# Patient Record
Sex: Female | Born: 1980 | Race: White | Hispanic: No | Marital: Single | Smoking: Current every day smoker
Health system: Southern US, Community
[De-identification: ages and names within clinical notes are randomized; demographics above are authoritative.]

## PROBLEM LIST (undated history)

## (undated) DIAGNOSIS — R87612 Low grade squamous intraepithelial lesion on cytologic smear of cervix (LGSIL): Secondary | ICD-10-CM

## (undated) DIAGNOSIS — N289 Disorder of kidney and ureter, unspecified: Secondary | ICD-10-CM

## (undated) DIAGNOSIS — D649 Anemia, unspecified: Secondary | ICD-10-CM

## (undated) DIAGNOSIS — N390 Urinary tract infection, site not specified: Secondary | ICD-10-CM

## (undated) HISTORY — PX: INCISIONAL HERNIA REPAIR: SHX193

## (undated) HISTORY — DX: Low grade squamous intraepithelial lesion on cytologic smear of cervix (LGSIL): R87.612

## (undated) HISTORY — PX: BLADDER REPAIR: SHX76

---
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
## 2002-01-06 ENCOUNTER — Inpatient Hospital Stay (HOSPITAL_COMMUNITY): Admission: AD | Admit: 2002-01-06 | Discharge: 2002-01-06 | Payer: Self-pay | Admitting: *Deleted

## 2002-01-13 ENCOUNTER — Inpatient Hospital Stay (HOSPITAL_COMMUNITY): Admission: AD | Admit: 2002-01-13 | Discharge: 2002-01-13 | Payer: Self-pay | Admitting: Obstetrics and Gynecology

## 2002-06-04 ENCOUNTER — Inpatient Hospital Stay (HOSPITAL_COMMUNITY): Admission: AD | Admit: 2002-06-04 | Discharge: 2002-06-04 | Payer: Self-pay | Admitting: Obstetrics & Gynecology

## 2002-06-06 ENCOUNTER — Inpatient Hospital Stay (HOSPITAL_COMMUNITY): Admission: AD | Admit: 2002-06-06 | Discharge: 2002-06-09 | Payer: Self-pay | Admitting: Obstetrics

## 2002-12-18 ENCOUNTER — Emergency Department (HOSPITAL_COMMUNITY): Admission: EM | Admit: 2002-12-18 | Discharge: 2002-12-18 | Payer: Self-pay | Admitting: Emergency Medicine

## 2003-03-25 ENCOUNTER — Inpatient Hospital Stay (HOSPITAL_COMMUNITY): Admission: AD | Admit: 2003-03-25 | Discharge: 2003-03-25 | Payer: Self-pay | Admitting: Family Medicine

## 2003-03-25 ENCOUNTER — Encounter: Payer: Self-pay | Admitting: Family Medicine

## 2003-06-06 ENCOUNTER — Ambulatory Visit (HOSPITAL_COMMUNITY): Admission: RE | Admit: 2003-06-06 | Discharge: 2003-06-06 | Payer: Self-pay | Admitting: *Deleted

## 2003-08-01 ENCOUNTER — Emergency Department (HOSPITAL_COMMUNITY): Admission: EM | Admit: 2003-08-01 | Discharge: 2003-08-01 | Payer: Self-pay | Admitting: Emergency Medicine

## 2003-09-17 ENCOUNTER — Ambulatory Visit (HOSPITAL_COMMUNITY): Admission: RE | Admit: 2003-09-17 | Discharge: 2003-09-17 | Payer: Self-pay | Admitting: *Deleted

## 2003-10-10 ENCOUNTER — Observation Stay (HOSPITAL_COMMUNITY): Admission: AD | Admit: 2003-10-10 | Discharge: 2003-10-11 | Payer: Self-pay | Admitting: Obstetrics and Gynecology

## 2003-10-29 ENCOUNTER — Inpatient Hospital Stay (HOSPITAL_COMMUNITY): Admission: RE | Admit: 2003-10-29 | Discharge: 2003-11-01 | Payer: Self-pay | Admitting: Family Medicine

## 2003-10-29 ENCOUNTER — Encounter (INDEPENDENT_AMBULATORY_CARE_PROVIDER_SITE_OTHER): Payer: Self-pay | Admitting: Specialist

## 2003-11-06 ENCOUNTER — Inpatient Hospital Stay (HOSPITAL_COMMUNITY): Admission: AD | Admit: 2003-11-06 | Discharge: 2003-11-06 | Payer: Self-pay | Admitting: Obstetrics

## 2003-12-14 ENCOUNTER — Inpatient Hospital Stay (HOSPITAL_COMMUNITY): Admission: AD | Admit: 2003-12-14 | Discharge: 2003-12-14 | Payer: Self-pay | Admitting: Family Medicine

## 2004-02-09 ENCOUNTER — Inpatient Hospital Stay (HOSPITAL_COMMUNITY): Admission: AD | Admit: 2004-02-09 | Discharge: 2004-02-09 | Payer: Self-pay | Admitting: Obstetrics and Gynecology

## 2004-04-03 ENCOUNTER — Emergency Department (HOSPITAL_COMMUNITY): Admission: EM | Admit: 2004-04-03 | Discharge: 2004-04-03 | Payer: Self-pay | Admitting: Emergency Medicine

## 2004-07-08 ENCOUNTER — Encounter: Admission: RE | Admit: 2004-07-08 | Discharge: 2004-07-08 | Payer: Self-pay | Admitting: Family Medicine

## 2004-12-07 ENCOUNTER — Emergency Department (HOSPITAL_COMMUNITY): Admission: EM | Admit: 2004-12-07 | Discharge: 2004-12-07 | Payer: Self-pay | Admitting: Emergency Medicine

## 2004-12-13 ENCOUNTER — Emergency Department (HOSPITAL_COMMUNITY): Admission: EM | Admit: 2004-12-13 | Discharge: 2004-12-13 | Payer: Self-pay | Admitting: *Deleted

## 2005-04-06 ENCOUNTER — Emergency Department (HOSPITAL_COMMUNITY): Admission: EM | Admit: 2005-04-06 | Discharge: 2005-04-06 | Payer: Self-pay | Admitting: Emergency Medicine

## 2005-06-19 ENCOUNTER — Emergency Department (HOSPITAL_COMMUNITY): Admission: EM | Admit: 2005-06-19 | Discharge: 2005-06-19 | Payer: Self-pay | Admitting: Emergency Medicine

## 2005-06-22 ENCOUNTER — Encounter: Admission: RE | Admit: 2005-06-22 | Discharge: 2005-06-22 | Payer: Self-pay | Admitting: Family Medicine

## 2005-07-24 ENCOUNTER — Inpatient Hospital Stay (HOSPITAL_COMMUNITY): Admission: AD | Admit: 2005-07-24 | Discharge: 2005-07-24 | Payer: Self-pay | Admitting: *Deleted

## 2005-07-25 ENCOUNTER — Emergency Department (HOSPITAL_COMMUNITY): Admission: EM | Admit: 2005-07-25 | Discharge: 2005-07-25 | Payer: Self-pay | Admitting: Emergency Medicine

## 2006-04-05 ENCOUNTER — Emergency Department: Payer: Self-pay | Admitting: Emergency Medicine

## 2006-04-06 ENCOUNTER — Ambulatory Visit: Payer: Self-pay | Admitting: Emergency Medicine

## 2007-04-14 ENCOUNTER — Emergency Department (HOSPITAL_COMMUNITY): Admission: EM | Admit: 2007-04-14 | Discharge: 2007-04-14 | Payer: Self-pay | Admitting: Emergency Medicine

## 2009-03-28 ENCOUNTER — Emergency Department (HOSPITAL_COMMUNITY): Admission: EM | Admit: 2009-03-28 | Discharge: 2009-03-28 | Payer: Self-pay | Admitting: Emergency Medicine

## 2011-03-13 NOTE — Discharge Summary (Signed)
[facility] of [facility]  Patient:    Lindsey Green, Lindsey Green                   MRN: [id number] Adm. Date:  [id number] Disc. Date: 02/04/00 Attending:  Deniece Ree                           Discharge Summary  HISTORY OF PRESENT ILLNESS:   The patient is a 30 year old primigravida who was  admitted by Kathreen Cosier, M.D. in my absence and at which time noted that the patient had a breech presentation.  A cesarean section was carried out which the patient tolerated the procedure very well without any problems.  HOSPITAL COURSE:              Postoperatively, she did very well without any complications and was discharged on her third postoperative day.  She was instructed on the following complications and care following this type of surgery. She was told to return to my office in four weeks for follow-up evaluation or to call me prior to that time should any problems arise. DD:  02/21/00 TD:  02/25/00 Job: [id number]

## 2011-03-13 NOTE — Discharge Summary (Signed)
Lindsey Green, Lindsey Green                      ACCOUNT NO.:  [id number]   MEDICAL RECORD NO.:  [id number]                   PATIENT TYPE:  INP   LOCATION:  9321                                 [facility]ACILITY:  [facility]   PHYSICIAN:  Charles A. Clearance Coots, M.D.             DATE OF BIRTH:  1980/12/14   DATE OF ADMISSION:  06/06/2002  DATE OF DISCHARGE:  06/09/2002                                 DISCHARGE SUMMARY   ADMISSION DIAGNOSES:  1. Ascending urinary tract infection.  2. Hyperemesis; could not tolerate oral medication.   DISCHARGE DIAGNOSES:  1. Ascending urinary tract infection.  2. Hyperemesis; could not tolerate oral medication.  3. Pyelonephritis, resolved.   DISPOSITION:  Discharged home in good condition, tolerating p.o. diet and  medication.   REASON FOR ADMISSION:  A 30 year old white female G2 P2, last menstrual  period May 31, 2002, presented to [facility] with a complaint of  back pain, fever, and severe nausea and vomiting, not being able to tolerate  p.o. antibiotic therapy for a urinary tract infection.  She was previously  seen in the maternity admissions unit on June 04, 2002 for a urinary tract  infection and was started on ciprofloxacin p.o.  The patient stated that she  took one pill and had severe nausea and vomiting, and then noted the  increase in fever over the next 24-48 hours with increased back pain and  continued nausea and vomiting to the point where she could not eat or take  her p.o. medication.   PAST MEDICAL HISTORY:  Surgery:  Cesarean section in 2001 for a breech  presentation; cesarean section in 2003 for nonreassuring fetal heart rate.  Illnesses:  None.   MEDICATIONS:  Ciprofloxacin.   ALLERGIES:  PENICILLIN - reaction of hives and difficulty breathing.   SOCIAL HISTORY:  Single.  Denies tobacco, alcohol, or recreational drug use.   FAMILY HISTORY:  Noncontributory.   REVIEW OF SYMPTOMS:  Per history of present illness.   PHYSICAL  EXAMINATION:  GENERAL:  Well-nourished, well-developed white female  in moderate GI distress.  VITAL SIGNS:  Temperature 101.3, pulse 102, respiratory rate 20, blood  pressure 122/58.  HEENT:  Benign.  LUNGS:  Clear to auscultation bilaterally.  CARDIOVASCULAR:  Revealed regular rate and rhythm without murmurs, rubs, or  gallops.  BACK:  Revealed bilateral lumbar up to the costovertebral angle tenderness  to percussion.  ABDOMEN:  Soft, nontender, positive bowel sounds.  No masses or organomegaly  appreciated.  PELVIC:  Omitted.   IMPRESSION:  Ascending urinary tract infection, rule out pyelonephritis; not  able to tolerate p.o. antibiotic therapy.   PLAN:  Admit for IV antibiotic therapy and supportive care.   LABORATORY VALUES:  Hemoglobin 11.8; hematocrit 34.8; white blood cell count  10,000; platelets 294,000.  Urine culture grew out greater than 100,000  E. coli, pansensitive.   HOSPITAL COURSE:  The patient was admitted and started on IV fluid  hydration  along with IV ciprofloxacin.  She responded well to IV antibiotic therapy  and by hospital day #2 was feeling much better with no CVA tenderness.  She  was discharged home much improved in good condition on hospital day #3, on  p.o. Macrobid to be taken for seven days of therapy.   DISCHARGE DISPOSITION:  1. Medications:  Macrobid 100 mg p.o. b.i.d. for seven days.  2. Routine written instructions were given for diet, activity, and follow-     up.  3. The patient is to call the office to make an appointment to see Dr.     Clearance Coots on June 12, 2002 for a follow-up assessment.                                               Charles A. Clearance Coots, M.D.    CAH/MEDQ  D:  06/09/2002  T:  06/09/2002  Job:  [id number]   cc:   ATTN:  Dr. Coral Ceo [facility]

## 2011-03-13 NOTE — Discharge Summary (Signed)
Lindsey Green, Lindsey Green                      ACCOUNT NO.:  [id number]   MEDICAL RECORD NO.:  [id number]                   PATIENT TYPE:  INP   LOCATION:  9110                                 [facility]ILITY[facility]  [facility]   PHYSICIAN:  Tanya S. Shawnie Pons, M.D.                DATE OF BIRTH:  1980/11/19   DATE OF ADMISSION:  10/29/2003  DATE OF DISCHARGE:  11/01/2003                                 DISCHARGE SUMMARY   DISCHARGE DIAGNOSES:  1. Pregnancy, delivered, term.  Cephalic, live birth.  2. Status post repeat low transverse cesarean section.   DISCHARGE MEDICATIONS:  1. Ibuprofen 600 mg p.o. q.6h. p.r.n. for pain.  2. Percocet 5/325 p.o. q.6h. p.r.n. for pain.  3. Ortho Evra patch.  4. Ferrous sulfate 325 mg p.o. b.i.d.   DISCHARGE INSTRUCTIONS:  1. Diet - regular.  2. Activity - no heavy lifting for 2 weeks, nothing in vagina for 6 weeks.  3. Follow up at the [facility] in 6 weeks.   DISPOSITION:  The patient was discharged to home.   REVIEW OF HISTORY:  This is a 30 year old, G3, P2-0-0-2, with two previous  cesarean sections, who was admitted for elective repeat low transverse  cesarean section.  The patient underwent repeat low transverse cesarean  section under spinal anesthesia, and delivered a live baby girl weighing 9  pounds, 6 ounces, with Apgar score of 8 at 1 minute, becoming 9 at 5  minutes.  Cord pH was 7.26.  Estimated blood loss was 1000 cc.   COURSE IN THE HOSPITAL:  The patient tolerated the procedure well.  The  patient remained afebrile with no complications postoperatively.  Hemoglobin  was 8.6, with hematocrit of 27.  The patient was started on iron  supplements.  Prior to discharge, the patient was given RhoGAM.   CONDITION ON DISCHARGE:  The patient was discharged on the third  postoperative day without complaints, except for slight tenderness at the  operative site.  The patient had stable vital signs - blood pressure 114/62,  pulse rate of 82, respiratory  rate 18, with a temperature of 97.7.  The  wound was dry, with no erythema or discharge.   FOLLOW UP:  The patient was advised to follow up at [facility] after 6  weeks.     Lawerance Sabal, MD                         Shelbie Proctor. Shawnie Pons, M.D.    MC/MEDQ  D:  12/17/2003  T:  12/17/2003  Job:  [id number]

## 2011-03-13 NOTE — Op Note (Signed)
Lindsey Green, Lindsey Green                      ACCOUNT NO.:  [id number]   MEDICAL RECORD NO.:  [id number]                   PATIENT TYPE:  INP   LOCATION:  9110                                 [facility]ILITY[facility]  [facility]   PHYSICIAN:  Tanya S. Shawnie Pons, M.D.                DATE OF BIRTH:  [birth year]   DATE OF PROCEDURE:  10/29/2003  DATE OF DISCHARGE:                                 OPERATIVE REPORT   PREOPERATIVE DIAGNOSES:  1. Intrauterine pregnancy at term.  2. Previous cesarean section x2.   POSTOPERATIVE DIAGNOSES:  1. Intrauterine pregnancy at term.  2. Previous cesarean section x2.   PROCEDURE:  Repeat low transverse cesarean section.   SURGEON:  Shelbie Proctor. Shawnie Pons, M.D.   ASSISTANT:  Lesly Dukes, M.D.   ANESTHESIA:  Spinal by Quillian Quince, M.D.   COMPLICATIONS:  None.   SPECIMENS:  Placenta to pathology.   FINDINGS:  Viable female infant, Apgars 8 and 9.  Weight 9 pounds 6 ounces.  Cord pH 7.26.   ESTIMATED BLOOD LOSS:  1000 mL.   INDICATIONS:  The patient is a 30 year old G3, P2 who has had two prior C-  sections with a history of large for gestational age infants who desired  repeat.   DESCRIPTION OF PROCEDURE:  The patient was taken to the operating room where  spinal anesthesia was administered.  She was then placed in a supine  position with a left lateral tilt.  She was prepped and draped in the usual  sterile fashion.  A Foley catheter was placed in the bladder.  Anesthesia  was found to be adequate, and a Pfannenstiel incision was made through the  skin down to the underlying fascia through her old incision.  The fascia was  divided sharply and then the superior edge of the fascia was dissected off  of the underlying rectus with electrocautery.  At that point, the peritoneal  cavity was entered and the incision extended by the surgeon on either side  laterally.  The bladder blade was then placed inside the cavity.  The  bladder was noted to be down from the  uterine incision.  The incision was  made with the knife in the low transverse fashion.  The amniotic cavity was  entered.  Clear fluid was noted.  The incision was extended with the bandage  scissors bilaterally.  Attempt was made to deliver the infant which could  not be delivered.  Vacuum was placed.  Still the infant could not be  delivered.  An adhesions to the fascia on the patient's left side was then  taken down and the vacuum applied again, and the infant delivered  atraumatically.  The baby did have spontaneous cry.  There was no nuchal  cord.  The cord was clamped x2 and cut.  The infant was taken to the  awaiting pediatricians.  Previously the Apgars were 8 and 9, weight 9 pounds  6 ounces.  The cord pH was then obtained as well as cord blood.  The  placenta delivered easily.  The uterus was cleaned with a dry lap pad and  all membranes removed.  Edges of the incision were grasped with ring forceps  and a 0 Vicryl suture was used to close the uterus in a locking running  fashion.  A second imbricating layer was used and good hemostasis was  obtained.  The gutters were irrigated and blood clots removed.  The incision  was again looked at and found to be hemostatic.  The fascia was then closed  with another inverted 0 Vicryl suture in a running fashion.  The  subcutaneous tissue was irrigated, and the bleeders were cauterized in the  same place.  Postoperatively, the incision was injected with 10 mL of 0.25%  Marcaine.  The patient tolerated the procedure well.  She was awakened and  taken to the recovery room in stable condition.  The infant was taken to the  regular nursery.                                               Shelbie Proctor. Shawnie Pons, M.D.    TSP/MEDQ  D:  10/29/2003  T:  10/29/2003  Job:  [id number]

## 2011-03-13 NOTE — Discharge Summary (Signed)
[facility] of [facility]  Patient:    Lindsey Green, Lindsey Green Visit Number: [id number] MRN: [id number]          Service Type: Attending:  Conni Elliot, M.D. Dictated by:   Ellwood Handler, M.D. Adm. Date:  12/31/01 Disc. Date: 01/03/02                             Discharge Summary  DISCHARGE DIAGNOSES: 1. Intrauterine pregnancy at [redacted] weeks gestation, status post delivery of    viable female infant. 2. Status post low transverse cesarean section secondary to fetal tachycardia. 3. Severe abdominal pain.  DISCHARGE MEDICATIONS: 1. Ibuprofen 600 mg one p.o. q.6h. p.r.n. pain. 2. Percocet one p.o. q.4-6h. p.r.n. moderate-to-severe pain. 3. Prenatal vitamin one p.o. q.d. x6 weeks.  HISTORY OF PRESENT ILLNESS:   Please see full H&P for details.  In brief, 30 year old gravida 2, para 1-0-0-1, at [redacted] weeks gestation, presented to Maternity Admissions with complaint of onset of sharp severe abdominal pain; the patient also reported contractions.  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  HOSPITAL COURSE:              The patient was taken to operating room shortly after admission for a low transverse cesarean section secondary to fetal tachycardia.  Intraoperative and postoperative course was uncomplicated.  A 9-pound 8-ounce female infant was born with Apgars of 8/9.  She decided to breast and bottle feed.  Postoperative hemoglobin was 9.7.  The patient remained clinically stable throughout hospitalization.  DISCHARGE INSTRUCTIONS:       The patient was discharged to home in clinically stable condition.  She received education and instruction booklet on postpartum care.  She was instructed to return to [facility] in six weeks for postpartum check; she was also told to return to Maternity Admissions in two to three days to have her staples  removed. Dictated by:   Ellwood Handler, M.D. Attending:  Conni Elliot, M.D. DD:  05/12/02 TD:  05/18/02 Job: [id number]

## 2011-03-13 NOTE — Op Note (Signed)
[facility] of [facility]  Patient:    Lindsey Green, Lindsey Green Visit Number: [id number] MRN: [id number]          Service Type: OBS Location: 910A 9134 01 Attending Physician:  Michaelle Copas Dictated by:   Conni Elliot, M.D. Proc. Date: 12/31/01 Admit Date:  12/31/2001                             Operative Report  PREOPERATIVE DIAGNOSES:       1. Intrauterine pregnancy at term.                               2. Fetal tachycardia.                               3. Rule out abruption.                               4. Severe abdominal pain.                               5. History of prior cesarean delivery.                               6. History of child with neonatal meconium                                  aspiration syndrome per patient.  POSTOPERATIVE DIAGNOSES:      1. Intrauterine pregnancy at term.                               2. Fetal tachycardia.                               3. Rule out abruption.                               4. Severe abdominal pain.                               5. History of prior cesarean delivery.                               6. History of child with neonatal meconium                                  aspiration syndrome per patient.  OPERATION:                    Low transverse cesarean section.  SURGEON:                      Conni Elliot, M.D.  ANESTHESIA:  Spinal.  OPERATIVE FINDINGS:           A 9 lb 8 oz female with Apgars of 8 and 9.  Cord pH and placenta were sent.  DESCRIPTION OF PROCEDURE:     After bringing the patient urgently to the operating room due to the fetal tachycardia with the patient supine in a left tilt position receiving oxygen, the abdomen was prepped and draped in a sterile fashion.  The abdomen was entered through a low transverse Pfannenstiel incision.  The incision was extended through the skin and subcutaneous fascia.  The rectus muscles were separated in the  midline.  A bladder flap was created.  A low transverse uterine incision was made. The amniotic fluid was clear.  The baby was in the vertex presentation.  The cord was doubly clamped and cut and the baby handed to the neonatologist in attendance.  The placenta delivered spontaneously.  The uterus, bladder flap, anterior peritoneum, fascia and skin were closed in routine fashion. Estimated blood loss was less than 800 cc.  Sponge, needle and instrument counts were correct. Dictated by:   Conni Elliot, M.D. Attending Physician:  Michaelle Copas DD:  01/01/02 TD:  01/02/02 Job: [id number]

## 2011-03-13 NOTE — Op Note (Signed)
[facility] of [facility]  Patient:    Lindsey Green, Lindsey Green                   MRN: [id number] Proc. Date: 02/01/00 Adm. Date:  [id number] Attending:  Deniece Ree                           Operative Report  PREOPERATIVE DIAGNOSIS:       Intrauterine pregnancy at term with rupture of membranes, breech presentation in labor.  POSTOPERATIVE DIAGNOSIS:  OPERATION:  SURGEON:                      Kathreen Cosier, M.D.  ASSISTANT:  ANESTHESIA:                   Spinal.  ESTIMATED BLOOD LOSS:  DESCRIPTION OF PROCEDURE:     The patient was placed on the operating table in he supine position.  The abdomen was prepped and draped.  The bladder was emptied ith a Foley catheter.  A transverse suprapubic incision was made and carried down to the rectus fascia.  The fascia was cleaned and incised the length of the incision. The rectus muscles were retracted laterally.  The peritoneum was incised longitudinally.  A transverse incision was made in the visceroperitoneum above he bladder and the bladder mobilized inferiorly.  A transverse lower uterine incision was made and the patient delivered of a frank breech female, Apgars 7 and 8.  The  team was in attendance.  The fluid was clear.  The baby weighed 8 pounds 8 ounces. There was a nuchal cord which was loose.  The placenta was posterior and removed manually.  The uterine cavity cleaned with dry laps.  The uterine incision was closed in one layer with continuous suture of #1 chromic interlocking sutures.  Hemostasis was satisfactory.  The bladder flap reattached with 2-0 chromic. The uterus was well contracted.  The tubes and ovaries were normal.  The abdomen was closed in layers.  The peritoneum with continuous suture of 0 chromic, the fascia with continuous suture of 0 Dexon, and the skin closed with subcuticular stitch of 3-0 plain.  Estimated blood loss was 400 cc.  The patient tolerated  the procedure well and was taken to the recovery room in good condition. DD:  02/01/00 TD:  02/02/00 Job: [id number]

## 2011-03-13 NOTE — H&P (Signed)
[facility] of [facility]  Patient:    Lindsey Green, Lindsey Green                   MRN: [id number] Adm. Date:  [id number] Attending:  Deniece Ree                         History and Physical  HISTORY OF PRESENT ILLNESS:   Patient is a 30 year old primigravida, [facility] February 04, 2000, who was admitted in labor with a breech presentation and on examination, she was 4-cm dilated, the breech was -3 station and she had premature rupture of membranes, her strep status was unknown and it was decided she would be delivered by a C-section because of primigravida breech, in labor.  PHYSICAL EXAMINATION:  GENERAL:                      Physical exam revealed a well-developed female in  labor.  HEENT:                        Negative.  LUNGS:                        Clear.  HEART:                        Regular rhythm.  No murmurs or gallops.  ABDOMEN:                      Term-size uterus.  Fetal heart 160 to 180.  PELVIC:                       As described above.  EXTREMITIES:                  Negative. DD:  02/01/00 TD:  02/02/00 Job: [id number]

## 2011-03-22 ENCOUNTER — Emergency Department (HOSPITAL_COMMUNITY)
Admission: EM | Admit: 2011-03-22 | Discharge: 2011-03-22 | Discharge status: Against Medical Advice | Payer: Self-pay | Attending: Emergency Medicine | Admitting: Emergency Medicine

## 2011-08-12 LAB — URINALYSIS, ROUTINE W REFLEX MICROSCOPIC
Bilirubin Urine: NEGATIVE
Glucose, UA: NEGATIVE
Protein, ur: 100 — AB
Urobilinogen, UA: 2 — ABNORMAL HIGH
pH: 6.5

## 2011-08-12 LAB — WET PREP, GENITAL: Yeast Wet Prep HPF POC: NONE SEEN

## 2011-08-12 LAB — GC/CHLAMYDIA PROBE AMP, GENITAL
Chlamydia, DNA Probe: NEGATIVE
GC Probe Amp, Genital: NEGATIVE

## 2011-08-12 LAB — URINE MICROSCOPIC-ADD ON

## 2011-08-12 LAB — RPR: RPR Ser Ql: NONREACTIVE

## 2011-08-30 ENCOUNTER — Encounter: Payer: Self-pay | Admitting: *Deleted

## 2011-08-30 ENCOUNTER — Emergency Department (HOSPITAL_COMMUNITY)
Admission: EM | Admit: 2011-08-30 | Discharge: 2011-08-30 | Disposition: A | Discharge status: Home / Self Care | Payer: Self-pay | Attending: Emergency Medicine | Admitting: Emergency Medicine

## 2011-08-30 DIAGNOSIS — F17200 Nicotine dependence, unspecified, uncomplicated: Secondary | ICD-10-CM | POA: Insufficient documentation

## 2011-08-30 DIAGNOSIS — S025XXA Fracture of tooth (traumatic), initial encounter for closed fracture: Secondary | ICD-10-CM | POA: Insufficient documentation

## 2011-08-30 DIAGNOSIS — X58XXXA Exposure to other specified factors, initial encounter: Secondary | ICD-10-CM | POA: Insufficient documentation

## 2011-08-30 DIAGNOSIS — S032XXA Dislocation of tooth, initial encounter: Secondary | ICD-10-CM

## 2011-08-30 DIAGNOSIS — K089 Disorder of teeth and supporting structures, unspecified: Secondary | ICD-10-CM | POA: Insufficient documentation

## 2011-08-30 HISTORY — DX: Anemia, unspecified: D64.9

## 2011-08-30 MED ORDER — HYDROCODONE-ACETAMINOPHEN 5-325 MG PO TABS
ORAL_TABLET | ORAL | Status: AC
  Filled 2011-08-30: qty 1

## 2011-08-30 MED ORDER — OXYCODONE-ACETAMINOPHEN 5-325 MG PO TABS: 2.0000 | ORAL_TABLET | ORAL | Status: AC | PRN

## 2011-08-30 MED ORDER — CLINDAMYCIN HCL 150 MG PO CAPS: 300.0000 mg | ORAL_CAPSULE | Freq: Four times a day (QID) | ORAL | Status: DC

## 2011-08-30 MED ORDER — OXYCODONE-ACETAMINOPHEN 5-325 MG PO TABS: 1.0000 | ORAL_TABLET | Freq: Once | ORAL | Status: DC

## 2011-08-30 MED ORDER — OXYCODONE-ACETAMINOPHEN 5-325 MG PO TABS
ORAL_TABLET | ORAL | Status: AC
  Filled 2011-08-30: qty 1

## 2011-08-30 NOTE — ED Provider Notes (Signed)
History     CSN: [id number] Arrival date & time: 08/30/2011  5:27 PM   First MD Initiated Contact with Patient 08/30/11 2000      Chief Complaint  Patient presents with  . Dental Pain    (Consider location/radiation/quality/duration/timing/severity/associated sxs/prior treatment) HPI Comments: Patient states that she's been having dental pain in her left upper mouth for the last 2 days. She did not have a dentist. She states that she noticed a truncal upper tooth had fallen now. She been excruciating pain and does not know when she is able to followup with a dentist. She denies fevers, night sweats, chills, headaches, change in vision. She is able to open and close her mouth and did not complain of any difficulty swallowing.  Patient is a 30 y.o. female presenting with tooth pain. The history is provided by the patient.  Dental Pain   Past Medical History  Diagnosis Date  . Anemia     Past Surgical History  Procedure Date  . Cesarean section     No family history on file.  History  Substance Use Topics  . Smoking status: Current Everyday Smoker  . Smokeless tobacco: Not on file  . Alcohol Use: Yes    OB History    Grav Para Term Preterm Abortions TAB SAB Ect Mult Living                  Review of Systems  All other systems reviewed and are negative.    Allergies  Penicillins  Home Medications  No current outpatient prescriptions on file.  BP 143/69  Pulse 85  Temp(Src) 97.9 F (36.6 C) (Oral)  Resp 20  SpO2 99%  Physical Exam  Constitutional: She is oriented to person, place, and time. She appears well-developed and well-nourished. No distress.  HENT:  Head: Normocephalic and atraumatic. No trismus in the jaw.  Mouth/Throat: Uvula is midline, oropharynx is clear and moist and mucous membranes are normal. No oral lesions. No dental abscesses or uvula swelling. No oropharyngeal exudate or tonsillar abscesses.    Eyes: Conjunctivae and EOM are  normal. Pupils are equal, round, and reactive to light. No scleral icterus.  Neck: Normal range of motion. Neck supple. No tracheal deviation present. No thyromegaly present.  Cardiovascular: Normal rate, regular rhythm, normal heart sounds and intact distal pulses.   Pulmonary/Chest: Effort normal and breath sounds normal. No stridor.  Abdominal: Soft. Bowel sounds are normal.  Musculoskeletal: Normal range of motion. She exhibits no edema and no tenderness.  Neurological: She is alert and oriented to person, place, and time. She has normal reflexes. Coordination normal.  Skin: Skin is warm and dry. No rash noted. She is not diaphoretic. No erythema. No pallor.  Psychiatric: She has a normal mood and affect. Her behavior is normal.    ED Course  Procedures (including critical care time)  Labs Reviewed - No data to display No results found.   No diagnosis found.    MDM          Jaci Carrel, [place] 08/30/11 2049

## 2011-08-30 NOTE — ED Notes (Signed)
Pt states she is having left upper tooth pain since yesterday with some facial swelling

## 2011-08-30 NOTE — ED Notes (Signed)
Pt. Has had a left upper toothache x 2 days

## 2011-08-30 NOTE — ED Notes (Signed)
Pt states that she has lt side tooth pain and needs pain meds

## 2011-08-31 NOTE — ED Provider Notes (Signed)
Medical screening examination/treatment/procedure(s) were performed by non-physician practitioner and as supervising physician I was immediately available for consultation/collaboration.   Nat Christen, MD 08/31/11 [id number]

## 2014-01-30 ENCOUNTER — Emergency Department (HOSPITAL_COMMUNITY): Payer: Medicaid Other

## 2014-01-30 ENCOUNTER — Encounter (HOSPITAL_COMMUNITY): Payer: Self-pay | Admitting: Emergency Medicine

## 2014-01-30 ENCOUNTER — Observation Stay (HOSPITAL_COMMUNITY)
Admission: AD | Admit: 2014-01-30 | Discharge: 2014-01-31 | Disposition: A | Discharge status: Home / Self Care | Payer: Medicaid Other | Source: Ambulatory Visit | Attending: Obstetrics & Gynecology | Admitting: Obstetrics & Gynecology

## 2014-01-30 DIAGNOSIS — D649 Anemia, unspecified: Secondary | ICD-10-CM | POA: Insufficient documentation

## 2014-01-30 DIAGNOSIS — A5901 Trichomonal vulvovaginitis: Secondary | ICD-10-CM

## 2014-01-30 DIAGNOSIS — R05 Cough: Secondary | ICD-10-CM | POA: Insufficient documentation

## 2014-01-30 DIAGNOSIS — N83209 Unspecified ovarian cyst, unspecified side: Secondary | ICD-10-CM

## 2014-01-30 DIAGNOSIS — R059 Cough, unspecified: Secondary | ICD-10-CM | POA: Insufficient documentation

## 2014-01-30 DIAGNOSIS — N730 Acute parametritis and pelvic cellulitis: Secondary | ICD-10-CM | POA: Diagnosis present

## 2014-01-30 DIAGNOSIS — Z975 Presence of (intrauterine) contraceptive device: Secondary | ICD-10-CM

## 2014-01-30 DIAGNOSIS — R0989 Other specified symptoms and signs involving the circulatory and respiratory systems: Secondary | ICD-10-CM | POA: Insufficient documentation

## 2014-01-30 DIAGNOSIS — F17200 Nicotine dependence, unspecified, uncomplicated: Secondary | ICD-10-CM | POA: Insufficient documentation

## 2014-01-30 DIAGNOSIS — R079 Chest pain, unspecified: Secondary | ICD-10-CM | POA: Insufficient documentation

## 2014-01-30 LAB — URINALYSIS, ROUTINE W REFLEX MICROSCOPIC
Bilirubin Urine: NEGATIVE
Glucose, UA: NEGATIVE mg/dL
Hgb urine dipstick: NEGATIVE
Ketones, ur: 15 mg/dL — AB
Nitrite: NEGATIVE
Protein, ur: 30 mg/dL — AB
Specific Gravity, Urine: 1.021 (ref 1.005–1.030)
Urobilinogen, UA: 1 mg/dL (ref 0.0–1.0)
pH: 6 (ref 5.0–8.0)

## 2014-01-30 LAB — BASIC METABOLIC PANEL
BUN: 6 mg/dL (ref 6–23)
CALCIUM: 8.8 mg/dL (ref 8.4–10.5)
CO2: 20 mEq/L (ref 19–32)
CREATININE: 0.86 mg/dL (ref 0.50–1.10)
Chloride: 101 mEq/L (ref 96–112)
GFR calc Af Amer: >90 mL/min (ref >90)
GFR, EST NON AFRICAN AMERICAN: 90 mL/min — AB (ref >90)
GLUCOSE: 85 mg/dL (ref 70–99)
Potassium: 3.9 mEq/L (ref 3.7–5.3)
SODIUM: 138 meq/L (ref 137–147)

## 2014-01-30 LAB — URINE MICROSCOPIC-ADD ON

## 2014-01-30 LAB — WET PREP, GENITAL: CLUE CELLS WET PREP: NONE SEEN

## 2014-01-30 LAB — CBC WITH DIFFERENTIAL/PLATELET
Basophils Absolute: 0 10*3/uL (ref 0.0–0.1)
Basophils Relative: 0 % (ref 0–1)
EOS PCT: 0 % (ref 0–5)
Eosinophils Absolute: 0 10*3/uL (ref 0.0–0.7)
HCT: 40.2 % (ref 36.0–46.0)
Hemoglobin: 13.8 g/dL (ref 12.0–15.0)
LYMPHS ABS: 0.8 10*3/uL (ref 0.7–4.0)
Lymphocytes Relative: 18 % (ref 12–46)
MCH: 31.2 pg (ref 26.0–34.0)
MCHC: 34.3 g/dL (ref 30.0–36.0)
MCV: 90.7 fL (ref 78.0–100.0)
MONO ABS: 0.6 10*3/uL (ref 0.1–1.0)
Monocytes Relative: 12 % (ref 3–12)
Neutro Abs: 3.3 10*3/uL (ref 1.7–7.7)
Neutrophils Relative %: 70 % (ref 43–77)
Platelets: 232 10*3/uL (ref 150–400)
RBC: 4.43 MIL/uL (ref 3.87–5.11)
RDW: 13.5 % (ref 11.5–15.5)
WBC: 4.7 10*3/uL (ref 4.0–10.5)

## 2014-01-30 LAB — HIV ANTIBODY (ROUTINE TESTING W REFLEX): HIV 1&2 Ab, 4th Generation: NONREACTIVE

## 2014-01-30 LAB — PREGNANCY, URINE: Preg Test, Ur: NEGATIVE

## 2014-01-30 MED ORDER — IBUPROFEN 600 MG PO TABS
600.0000 mg | ORAL_TABLET | Freq: Four times a day (QID) | ORAL | Status: DC | PRN
  Administered 2014-01-31: 600 mg via ORAL
  Filled 2014-01-30: qty 1

## 2014-01-30 MED ORDER — DEXTROSE 5 % IV SOLN
2.0000 mg/kg | Freq: Once | INTRAVENOUS | Status: AC
  Administered 2014-01-30: 140 mg via INTRAVENOUS
  Filled 2014-01-30: qty 3.5

## 2014-01-30 MED ORDER — HYDROMORPHONE HCL PF 1 MG/ML IJ SOLN: 1.0000 mg | INTRAMUSCULAR | Status: AC | PRN

## 2014-01-30 MED ORDER — CLINDAMYCIN PHOSPHATE 900 MG/50ML IV SOLN
900.0000 mg | Freq: Once | INTRAVENOUS | Status: AC
  Administered 2014-01-30: 900 mg via INTRAVENOUS
  Filled 2014-01-30: qty 50

## 2014-01-30 MED ORDER — ONDANSETRON HCL 4 MG/2ML IJ SOLN
4.0000 mg | Freq: Once | INTRAMUSCULAR | Status: AC
  Administered 2014-01-30: 4 mg via INTRAVENOUS
  Filled 2014-01-30: qty 2

## 2014-01-30 MED ORDER — HYDROMORPHONE HCL PF 1 MG/ML IJ SOLN: 1.0000 mg | INTRAMUSCULAR | Status: DC | PRN

## 2014-01-30 MED ORDER — METRONIDAZOLE 500 MG PO TABS: 500.0000 mg | ORAL_TABLET | Freq: Four times a day (QID) | ORAL | Status: DC

## 2014-01-30 MED ORDER — OXYCODONE-ACETAMINOPHEN 5-325 MG PO TABS
1.0000 | ORAL_TABLET | ORAL | Status: DC | PRN
  Administered 2014-01-30 – 2014-01-31 (×3): 2 via ORAL
  Filled 2014-01-30 (×3): qty 2

## 2014-01-30 MED ORDER — PRENATAL MULTIVITAMIN CH
1.0000 | ORAL_TABLET | Freq: Every day | ORAL | Status: DC
  Administered 2014-01-31: 1 via ORAL
  Filled 2014-01-30: qty 1

## 2014-01-30 MED ORDER — MORPHINE SULFATE 4 MG/ML IJ SOLN
4.0000 mg | Freq: Once | INTRAMUSCULAR | Status: AC
Start: 2014-01-30 — End: 2014-01-30
  Administered 2014-01-30: 4 mg via INTRAVENOUS
  Filled 2014-01-30: qty 1

## 2014-01-30 MED ORDER — METRONIDAZOLE 500 MG PO TABS
2000.0000 mg | ORAL_TABLET | Freq: Once | ORAL | Status: AC
  Administered 2014-01-30: 2000 mg via ORAL
  Filled 2014-01-30: qty 4

## 2014-01-30 MED ORDER — LACTATED RINGERS IV SOLN
INTRAVENOUS | Status: DC
  Administered 2014-01-30 – 2014-01-31 (×2): via INTRAVENOUS

## 2014-01-30 MED ORDER — ONDANSETRON HCL 4 MG/2ML IJ SOLN
4.0000 mg | Freq: Three times a day (TID) | INTRAMUSCULAR | Status: AC | PRN
  Administered 2014-01-31: 4 mg via INTRAVENOUS
  Filled 2014-01-30: qty 2

## 2014-01-30 MED ORDER — ONDANSETRON HCL 4 MG/2ML IJ SOLN: 4.0000 mg | Freq: Three times a day (TID) | INTRAMUSCULAR | Status: DC | PRN

## 2014-01-30 MED ORDER — CLINDAMYCIN PHOSPHATE 900 MG/50ML IV SOLN
900.0000 mg | Freq: Three times a day (TID) | INTRAVENOUS | Status: DC
Start: 2014-01-30 — End: 2014-01-30

## 2014-01-30 MED ORDER — GENTAMICIN SULFATE 40 MG/ML IJ SOLN
Freq: Three times a day (TID) | INTRAVENOUS | Status: DC
  Administered 2014-01-30 – 2014-01-31 (×3): via INTRAVENOUS
  Filled 2014-01-30 (×5): qty 3.25

## 2014-01-30 NOTE — ED Provider Notes (Signed)
CSN: [id number]     Arrival date & time 01/30/14  7564 History   First MD Initiated Contact with Patient 01/30/14 1000     Chief Complaint  Patient presents with  . Vaginitis     (Consider location/radiation/quality/duration/timing/severity/associated sxs/prior Treatment) HPI Comments: Patient is a G23 P57 33 year old female past medical history significant for anemia, tobacco use presenting to the emergency department for multiple complaints. Patient's first complaint is one week of suprapubic discomfort, low back pain, urinary urgency and decreased urine. Patient states this feels like prior bladder infections. Patient's complaint is 1 week of vaginal itching and white vaginal discharge. She has tried Monistat with no improvement of her symptoms. Patient endorses she has had a new sexual partner and has had recent unprotected sexual course. He does have a history of chlamydial infections. Patient's last complaint is a few days of her is, rhinorrhea, productive cough, posttussis chest tightness. Endorses fever and chills.   Past Medical History  Diagnosis Date  . Anemia    Past Surgical History  Procedure Laterality Date  . Cesarean section     History reviewed. No pertinent family history. History  Substance Use Topics  . Smoking status: Current Every Day Smoker  . Smokeless tobacco: Not on file  . Alcohol Use: Yes   OB History   Grav Para Term Preterm Abortions TAB SAB Ect Mult Living                 Review of Systems  Constitutional: Positive for fever (TMAX 101) and chills.  Respiratory: Positive for cough and chest tightness.   Gastrointestinal: Positive for abdominal distention.  Genitourinary: Positive for dysuria, urgency, frequency, decreased urine volume and vaginal discharge.  Musculoskeletal: Positive for back pain.  All other systems reviewed and are negative.      Allergies  Penicillins  Home Medications   Current Outpatient Rx  Name  Route  Sig   Dispense  Refill  . acetaminophen (TYLENOL) 500 MG tablet   Oral   Take 1,000 mg by mouth every 6 (six) hours as needed for fever.         Marland Kitchen ibuprofen (ADVIL,MOTRIN) 200 MG tablet   Oral   Take 400 mg by mouth every 6 (six) hours as needed for fever or mild pain.         . IRON, FERROUS GLUCONATE, PO   Oral   Take 1 tablet by mouth daily as needed (low iron).         Marland Kitchen levonorgestrel (MIRENA) 20 MCG/24HR IUD   Intrauterine   1 each by Intrauterine route once.          BP 110/58  Pulse 94  Temp(Src) 99.9 F (37.7 C) (Oral)  Resp 18  Wt 157 lb 3 oz (71.3 kg)  SpO2 97% Physical Exam  Nursing note and vitals reviewed. Constitutional: She is oriented to person, place, and time. She appears well-developed and well-nourished. No distress.  HENT:  Head: Normocephalic and atraumatic.  Right Ear: External ear normal.  Left Ear: External ear normal.  Nose: Nose normal.  Mouth/Throat: Oropharynx is clear and moist. No oropharyngeal exudate.  Eyes: Conjunctivae are normal.  Neck: Normal range of motion. Neck supple.  Cardiovascular: Normal rate, regular rhythm and normal heart sounds.   Pulmonary/Chest: Effort normal. No accessory muscle usage. No respiratory distress. She has no decreased breath sounds. She has rhonchi. She exhibits no tenderness.  Abdominal: Soft. Bowel sounds are normal. She exhibits no distension. There is  tenderness in the suprapubic area. There is no rigidity, no rebound, no guarding and no CVA tenderness.  Musculoskeletal: Normal range of motion.  Neurological: She is alert and oriented to person, place, and time.  Skin: Skin is warm and dry. She is not diaphoretic.  Psychiatric: She has a normal mood and affect.   Exam performed by Francee Piccolo L,  exam chaperoned Date: 01/30/2014 Pelvic exam: normal external genitalia without evidence of trauma. VULVA: normal appearing vulva with no masses, tenderness or lesion. VAGINA: normal appearing  vagina with normal color and discharge, no lesions. CERVIX: normal appearing cervix without lesions, cervical motion tenderness present, cervical os closed with out purulent discharge; vaginal discharge - copious and dark, Wet prep and DNA probe for chlamydia and GC obtained.   ADNEXA: normal adnexa in size, nontender and no masses UTERUS: uterus is normal size, shape, consistency and nontender.    ED Course  Procedures (including critical care time) Medications  HYDROmorphone (DILAUDID) injection 1 mg (not administered)  ondansetron (ZOFRAN) injection 4 mg (not administered)  morphine 4 MG/ML injection 4 mg (4 mg Intravenous Given 01/30/14 1317)  ondansetron (ZOFRAN) injection 4 mg (4 mg Intravenous Given 01/30/14 1317)  clindamycin (CLEOCIN) IVPB 900 mg (0 mg Intravenous Stopped 01/30/14 1400)  gentamicin (GARAMYCIN) 140 mg in dextrose 5 % 50 mL IVPB (0 mg Intravenous Stopped 01/30/14 1512)    Labs Review Labs Reviewed  WET PREP, GENITAL - Abnormal; Notable for the following:    Yeast Wet Prep HPF POC FEW (*)    Trich, Wet Prep FEW (*)    WBC, Wet Prep HPF POC FEW (*)    All other components within normal limits  URINALYSIS, ROUTINE W REFLEX MICROSCOPIC - Abnormal; Notable for the following:    APPearance CLOUDY (*)    Ketones, ur 15 (*)    Protein, ur 30 (*)    Leukocytes, UA LARGE (*)    All other components within normal limits  URINE MICROSCOPIC-ADD ON - Abnormal; Notable for the following:    Squamous Epithelial / LPF MANY (*)    Bacteria, UA MANY (*)    All other components within normal limits  BASIC METABOLIC PANEL - Abnormal; Notable for the following:    GFR calc non Af Amer 90 (*)    All other components within normal limits  GC/CHLAMYDIA PROBE AMP  PREGNANCY, URINE  CBC WITH DIFFERENTIAL  HIV ANTIBODY (ROUTINE TESTING)  CBC WITH DIFFERENTIAL   Imaging Review Dg Chest 2 View  01/30/2014   CLINICAL DATA:  Cough and congestion.  Mid chest pain.  EXAM: CHEST  2 VIEW   COMPARISON:  None.  FINDINGS: The heart size and mediastinal contours are within normal limits. Both lungs are clear. The visualized skeletal structures are unremarkable.  IMPRESSION: Negative two view chest.   Electronically Signed   By: Gennette Pac M.D.   On: 01/30/2014 11:30   US Transvaginal Non-ob  01/30/2014   CLINICAL DATA:  Possible pelvic inflammatory disease; history of previous Cesarean sections; possible IUD in place  EXAM: TRANSABDOMINAL AND TRANSVAGINAL ULTRASOUND OF PELVIS  TECHNIQUE: Both transabdominal and transvaginal ultrasound examinations of the pelvis were performed. Transabdominal technique was performed for global imaging of the pelvis including uterus, ovaries, adnexal regions, and pelvic cul-de-sac. It was necessary to proceed with endovaginal exam following the transabdominal exam to visualize the ovaries.  COMPARISON:  None  FINDINGS: Uterus  Measurements: 7.1 x 4.2 x 5.1 cm. An IUD is in place.  Endometrium  Thickness: 9.8 mm.  No focal abnormality visualized.  Right ovary  Measurements: 3.0 x 2.5 x 2.3 cm. In the right ovary there is hypoechoic focus measuring 1.5 x 1.3 x 1.4 cm. This is hypoechoic with respect to the remainder of the ovary.  Left ovary  Measurements: 4.5 x 2.7 x 2.7 cm. There is a simple appearing cyst associated with the left ovary measuring 3.7 x 1.9 x 1.6 cm.  Other findings  No free fluid.  IMPRESSION: 1. The uterus is normal in echotexture and contour. An IUD is present. The endometrial stripe measures just under 10 mm. 2. There is a hypoechoic focus within the right ovary measuring 1.5 cm in greatest dimension. This likely reflects a hemorrhagic cyst. 3. There is a simple appearing cyst associated with the left ovary measuring 3.7 cm in greatest dimension. 4. There is no free fluid in the pelvis. 5. Correlation with patient's clinical and laboratory values is needed. Followup pelvic ultrasound or MRI is available upon request.   Electronically Signed   By:  David  [place]   On: 01/30/2014 15:04   US Pelvis Complete  01/30/2014   CLINICAL DATA:  Possible pelvic inflammatory disease; history of previous Cesarean sections; possible IUD in place  EXAM: TRANSABDOMINAL AND TRANSVAGINAL ULTRASOUND OF PELVIS  TECHNIQUE: Both transabdominal and transvaginal ultrasound examinations of the pelvis were performed. Transabdominal technique was performed for global imaging of the pelvis including uterus, ovaries, adnexal regions, and pelvic cul-de-sac. It was necessary to proceed with endovaginal exam following the transabdominal exam to visualize the ovaries.  COMPARISON:  None  FINDINGS: Uterus  Measurements: 7.1 x 4.2 x 5.1 cm. An IUD is in place.  Endometrium  Thickness: 9.8 mm.  No focal abnormality visualized.  Right ovary  Measurements: 3.0 x 2.5 x 2.3 cm. In the right ovary there is hypoechoic focus measuring 1.5 x 1.3 x 1.4 cm. This is hypoechoic with respect to the remainder of the ovary.  Left ovary  Measurements: 4.5 x 2.7 x 2.7 cm. There is a simple appearing cyst associated with the left ovary measuring 3.7 x 1.9 x 1.6 cm.  Other findings  No free fluid.  IMPRESSION: 1. The uterus is normal in echotexture and contour. An IUD is present. The endometrial stripe measures just under 10 mm. 2. There is a hypoechoic focus within the right ovary measuring 1.5 cm in greatest dimension. This likely reflects a hemorrhagic cyst. 3. There is a simple appearing cyst associated with the left ovary measuring 3.7 cm in greatest dimension. 4. There is no free fluid in the pelvis. 5. Correlation with patient's clinical and laboratory values is needed. Followup pelvic ultrasound or MRI is available upon request.   Electronically Signed   By: David  [place]   On: 01/30/2014 15:04     EKG Interpretation None      MDM   Final diagnoses:  PID (acute pelvic inflammatory disease)    Filed Vitals:   01/30/14 1334  BP: 110/58  Pulse: 94  Temp:   Resp: 18   Afebrile, NAD,  non-toxic appearing, AAOx4. I have reviewed nursing notes, vital signs, and all appropriate lab and imaging results for this patient.   12:54 PM Discussed patient with Dr. Adrian Blackwater recommends Pelvic US to r/o tubo-ovarian abscess, but does agree patient will likely need admission   Patient with one week of pelvic discomfort, vaginal discharge itching, urinary symptoms, fever chills. Abdominal exam is benign. No CVA tenderness. Pelvic exam reveals severe cervical motion  tenderness with vaginal discharge. Cervical os is closed. No adnexal fullness or tenderness.Wet prep obtained with a few WBCs. Urine reviewed. Question of contamination from source or possible PID infection. Patient has severe penicillin allergy will require clindamycin and gentamicin for treatment. Given fever, and the duration of symptoms and pelvic exam with negative ultrasound for tubo-ovarian abscess patient will require hospitalization with IV antibiotics for PID infection. [facility]. Dr. Adrian BlackwaterStinson will be accepting the patient. EMTALA placed. Patient d/w with Dr. Criss AlvineGoldston, agrees with plan.     Jeannetta EllisJennifer L Tulani Kidney, PA-C 01/30/14 1534

## 2014-01-30 NOTE — Progress Notes (Signed)
ANTIBIOTIC CONSULT NOTE - INITIAL  Pharmacy Consult for Gentamicin Indication: PID  Allergies  Allergen Reactions  . Penicillins Anaphylaxis    Patient Measurements: Height: 5\' 6"  (167.6 cm) Weight: 154 lb (69.854 kg) IBW/kg (Calculated) : 59.3 Adjusted Body Weight: 62.5kg  Vital Signs: Temp: 98.3 F (36.8 C) (04/07 2137) Temp src: Oral (04/07 2137) BP: 100/62 mmHg (04/07 2137) Pulse Rate: 97 (04/07 2137) Intake/Output from previous day:   Intake/Output from this shift: Total I/O In: 240 [P.O.:240] Out: 125 [Urine:125]  Labs:  Recent Labs  01/30/14 1320  WBC 4.7  HGB 13.8  PLT 232  CREATININE 0.86   Estimated Creatinine Clearance: 89.5 ml/min (by C-G formula based on Cr of 0.86).   Microbiology: Recent Results (from the past 720 hour(s))  WET PREP, GENITAL     Status: Abnormal   Collection Time    01/30/14 11:45 AM      Result Value Ref Range Status   Yeast Wet Prep HPF POC FEW (*) NONE SEEN Final   Trich, Wet Prep FEW (*) NONE SEEN Final   Clue Cells Wet Prep HPF POC NONE SEEN  NONE SEEN Final   WBC, Wet Prep HPF POC FEW (*) NONE SEEN Final    Medical History: Past Medical History  Diagnosis Date  . Anemia     Medications:  Flagyl 2 gram po x 1 dose. Clindamycin 900mg  IV q8h Assessment: 32yo F admitted with acute PID. IV Antibiotics initiated until oral therapy appropriate.  Goal of Therapy:  Gentamicin peaks 6-408mcg/ml and trough < 411mcg/ml  Plan:  1. Gentamicin 130mg  IV q8h. 2. Will continue to follow and check Gentamicin levels based on duration or clinical status of patient.  Thanks!  Claybon Jabsngel, Damira Kem G 01/30/2014,9:57 PM

## 2014-01-30 NOTE — H&P (Signed)
HPI Comments: Patient is a 424 P244 33 year old female past medical history significant for anemia, tobacco use presenting to the emergency department for multiple complaints. Patient's first complaint is one week of suprapubic discomfort, low back pain, urinary urgency and decreased urine. Patient states this feels like prior bladder infections. Patient's complaint is 1 week of vaginal itching and white vaginal discharge. She has tried Monistat with no improvement of her symptoms. Patient endorses she has had a new sexual partner and has had recent unprotected sexual course. He does have a history of chlamydial infections. Patient's last complaint is a few days of her is, rhinorrhea, productive cough, posttussis chest tightness. Endorses fever and chills.  History and [facility] reviewed and updated. Past Medical History   Diagnosis  Date   .  Anemia     Past Surgical History   Procedure  Laterality  Date   .  Cesarean section     History reviewed. No pertinent family history.  History   Substance Use Topics   .  Smoking status:  Current Every Day Smoker   .  Smokeless tobacco:  Not on file   .  Alcohol Use:  Yes    OB History    Grav  Para  Term  Preterm  Abortions  TAB  SAB  Ect  Mult  Living                 Review of Systems  Constitutional: Positive for fever (TMAX 101) and chills.  Respiratory: Positive for cough and chest tightness.  Gastrointestinal: Positive for abdominal distention.  Genitourinary: Positive for dysuria, urgency, frequency, decreased urine volume and vaginal discharge.  Musculoskeletal: Positive for back pain.  All other systems reviewed and are negative.  Allergies   Penicillins  Home Medications    Current Outpatient Rx   Name   Route   Sig   Dispense   Refill   .  acetaminophen (TYLENOL) 500 MG tablet   Oral   Take 1,000 mg by mouth every 6 (six) hours as needed for fever.       Marland Kitchen.  ibuprofen (ADVIL,MOTRIN) 200 MG tablet   Oral   Take 400 mg  by mouth every 6 (six) hours as needed for fever or mild pain.       .  IRON, FERROUS GLUCONATE, PO   Oral   Take 1 tablet by mouth daily as needed (low iron).       Marland Kitchen.  levonorgestrel (MIRENA) 20 MCG/24HR IUD   Intrauterine   1 each by Intrauterine route once.       BP 110/58  Pulse 94  Temp(Src) 99.9 F (37.7 C) (Oral)  Resp 18  Wt 157 lb 3 oz (71.3 kg)  SpO2 97%  Physical Exam  Nursing note and vitals reviewed.  Constitutional: She is oriented to person, place, and time. She appears well-developed and well-nourished. No distress.  HENT:  Head: Normocephalic and atraumatic.  Right Ear: External ear normal.  Left Ear: External ear normal.  Nose: Nose normal.  Mouth/Throat: Oropharynx is clear and moist. No oropharyngeal exudate.  Eyes: Conjunctivae are normal.  Neck: Normal range of motion. Neck supple.  Cardiovascular: Normal rate, regular rhythm and normal heart sounds.  Pulmonary/Chest: Effort normal. No accessory muscle usage. No respiratory distress. She has no decreased breath sounds. She has rhonchi. She exhibits no tenderness.  Abdominal: Soft. Bowel sounds are normal. She exhibits no distension. There is tenderness in the suprapubic area.  There is no rigidity, no rebound, no guarding and no CVA tenderness.  Musculoskeletal: Normal range of motion.  Neurological: She is alert and oriented to person, place, and time.  Skin: Skin is warm and dry. She is not diaphoretic.  Psychiatric: She has a normal mood and affect.  Exam performed by Francee Piccolo L, exam chaperoned  Date: 01/30/2014  Pelvic exam: normal external genitalia without evidence of trauma.  VULVA: normal appearing vulva with no masses, tenderness or lesion.  VAGINA: normal appearing vagina with normal color and discharge, no lesions.  CERVIX: normal appearing cervix without lesions, cervical motion tenderness present, cervical os closed with out purulent discharge; vaginal discharge - copious and dark, Wet  prep and DNA probe for chlamydia and GC obtained.  ADNEXA: normal adnexa in size, nontender and no masses  UTERUS: uterus is normal size, shape, consistency and nontender.  ED Course   Procedures (including critical care time)  Medications   HYDROmorphone (DILAUDID) injection 1 mg (not administered)   ondansetron (ZOFRAN) injection 4 mg (not administered)   morphine 4 MG/ML injection 4 mg (4 mg Intravenous Given 01/30/14 1317)   ondansetron (ZOFRAN) injection 4 mg (4 mg Intravenous Given 01/30/14 1317)   clindamycin (CLEOCIN) IVPB 900 mg (0 mg Intravenous Stopped 01/30/14 1400)   gentamicin (GARAMYCIN) 140 mg in dextrose 5 % 50 mL IVPB (0 mg Intravenous Stopped 01/30/14 1512)   Labs Review  Labs Reviewed   WET PREP, GENITAL - Abnormal; Notable for the following:    Yeast Wet Prep HPF POC  FEW (*)     Trich, Wet Prep  FEW (*)     WBC, Wet Prep HPF POC  FEW (*)     All other components within normal limits   URINALYSIS, ROUTINE W REFLEX MICROSCOPIC - Abnormal; Notable for the following:    APPearance  CLOUDY (*)     Ketones, ur  15 (*)     Protein, ur  30 (*)     Leukocytes, UA  LARGE (*)     All other components within normal limits   URINE MICROSCOPIC-ADD ON - Abnormal; Notable for the following:    Squamous Epithelial / LPF  MANY (*)     Bacteria, UA  MANY (*)     All other components within normal limits   BASIC METABOLIC PANEL - Abnormal; Notable for the following:    GFR calc non Af Amer  90 (*)     All other components within normal limits   GC/CHLAMYDIA PROBE AMP   PREGNANCY, URINE   CBC WITH DIFFERENTIAL   HIV ANTIBODY (ROUTINE TESTING)   CBC WITH DIFFERENTIAL   Imaging Review  Dg Chest 2 View  01/30/2014 CLINICAL DATA: Cough and congestion. Mid chest pain. EXAM: CHEST 2 VIEW COMPARISON: None. FINDINGS: The heart size and mediastinal contours are within normal limits. Both lungs are clear. The visualized skeletal structures are unremarkable. IMPRESSION: Negative two view chest.  Electronically Signed By: Gennette Pac M.D. On: 01/30/2014 11:30  US Transvaginal Non-ob  01/30/2014 CLINICAL DATA: Possible pelvic inflammatory disease; history of previous Cesarean sections; possible IUD in place EXAM: TRANSABDOMINAL AND TRANSVAGINAL ULTRASOUND OF PELVIS TECHNIQUE: Both transabdominal and transvaginal ultrasound examinations of the pelvis were performed. Transabdominal technique was performed for global imaging of the pelvis including uterus, ovaries, adnexal regions, and pelvic cul-de-sac. It was necessary to proceed with endovaginal exam following the transabdominal exam to visualize the ovaries. COMPARISON: None FINDINGS: Uterus Measurements: 7.1 x 4.2 x 5.1 cm.  An IUD is in place. Endometrium Thickness: 9.8 mm. No focal abnormality visualized. Right ovary Measurements: 3.0 x 2.5 x 2.3 cm. In the right ovary there is hypoechoic focus measuring 1.5 x 1.3 x 1.4 cm. This is hypoechoic with respect to the remainder of the ovary. Left ovary Measurements: 4.5 x 2.7 x 2.7 cm. There is a simple appearing cyst associated with the left ovary measuring 3.7 x 1.9 x 1.6 cm. Other findings No free fluid. IMPRESSION: 1. The uterus is normal in echotexture and contour. An IUD is present. The endometrial stripe measures just under 10 mm. 2. There is a hypoechoic focus within the right ovary measuring 1.5 cm in greatest dimension. This likely reflects a hemorrhagic cyst. 3. There is a simple appearing cyst associated with the left ovary measuring 3.7 cm in greatest dimension. 4. There is no free fluid in the pelvis. 5. Correlation with patient's clinical and laboratory values is needed. Followup pelvic ultrasound or MRI is available upon request. Electronically Signed By: David [place] On: 01/30/2014 15:04  US Pelvis Complete  01/30/2014 CLINICAL DATA: Possible pelvic inflammatory disease; history of previous Cesarean sections; possible IUD in place EXAM: TRANSABDOMINAL AND TRANSVAGINAL ULTRASOUND OF PELVIS  TECHNIQUE: Both transabdominal and transvaginal ultrasound examinations of the pelvis were performed. Transabdominal technique was performed for global imaging of the pelvis including uterus, ovaries, adnexal regions, and pelvic cul-de-sac. It was necessary to proceed with endovaginal exam following the transabdominal exam to visualize the ovaries. COMPARISON: None FINDINGS: Uterus Measurements: 7.1 x 4.2 x 5.1 cm. An IUD is in place. Endometrium Thickness: 9.8 mm. No focal abnormality visualized. Right ovary Measurements: 3.0 x 2.5 x 2.3 cm. In the right ovary there is hypoechoic focus measuring 1.5 x 1.3 x 1.4 cm. This is hypoechoic with respect to the remainder of the ovary. Left ovary Measurements: 4.5 x 2.7 x 2.7 cm. There is a simple appearing cyst associated with the left ovary measuring 3.7 x 1.9 x 1.6 cm. Other findings No free fluid. IMPRESSION: 1. The uterus is normal in echotexture and contour. An IUD is present. The endometrial stripe measures just under 10 mm. 2. There is a hypoechoic focus within the right ovary measuring 1.5 cm in greatest dimension. This likely reflects a hemorrhagic cyst. 3. There is a simple appearing cyst associated with the left ovary measuring 3.7 cm in greatest dimension. 4. There is no free fluid in the pelvis. 5. Correlation with patient's clinical and laboratory values is needed. Followup pelvic ultrasound or MRI is available upon request. Electronically Signed By: David [place] On: 01/30/2014 15:04  EKG Interpretation  None  MDM    Final diagnoses:   PID (acute pelvic inflammatory disease)    Filed Vitals:    01/30/14 1334   BP:  110/58   Pulse:  94   Temp:    Resp:  18    Imp: Admitted for acute PID, on IV antibiotics. Trichomonas on wet prep and urinalysis. Will add 2 grams PO Flagyl to antibiotics, follow for clinical improvement.   Plan Antibiotics IV until afebrile and less pain. Recommend that MIrena be left in place unless she does not  improve.   Adam Phenix, MD 01/30/2014

## 2014-01-30 NOTE — ED Notes (Signed)
Patient transported to X-ray 

## 2014-01-30 NOTE — ED Notes (Signed)
Patient transported to Ultrasound 

## 2014-01-30 NOTE — ED Notes (Signed)
Per pt sts back pain, urinary retention, fever, and vaginal discharge. sts that she used OTC meds without relief. sts new sexual partner and unprotected sex.

## 2014-01-31 DIAGNOSIS — N730 Acute parametritis and pelvic cellulitis: Principal | ICD-10-CM

## 2014-01-31 LAB — GC/CHLAMYDIA PROBE AMP
CT PROBE, AMP APTIMA: NEGATIVE
GC PROBE AMP APTIMA: NEGATIVE

## 2014-01-31 MED ORDER — DOXYCYCLINE HYCLATE 50 MG PO CAPS: 100.0000 mg | ORAL_CAPSULE | Freq: Two times a day (BID) | ORAL | Status: AC

## 2014-01-31 NOTE — ED Provider Notes (Signed)
Medical screening examination/treatment/procedure(s) were performed by non-physician practitioner and as supervising physician I was immediately available for consultation/collaboration.   EKG Interpretation None        Audree CamelScott T Dorian Renfro, MD 01/31/14 [phone]

## 2014-01-31 NOTE — Discharge Summary (Signed)
Physician Discharge Summary  Patient ID: Lindsey Green MRN: [id number] DOB/AGE: 06-09-1981 32 y.o.  Admit date: 01/30/2014 Discharge date: 01/31/2014  Admission Diagnoses:  Discharge Diagnoses:  Active Problems:   PID (acute pelvic inflammatory disease)   IUD (intrauterine device) in place   Discharged Condition: good  Hospital Course: Patient admitted for PID on clindamycin and gentamycin.  Was discovered to have trichomonas and given flagyl 2g.  Her symptoms improved rapidly and was afebrile and nontender after 24 hours.  Patient discharged to home with oral doxycycline to follow up in outpatient clinics.  Consults: None  Significant Diagnostic Studies: microbiology: trichomonas  Treatments: antibiotics: gentamycin, metronidazole and clindamycin   Discharge Exam: Blood pressure 114/72, pulse 73, temperature 98 F (36.7 C), temperature source Oral, resp. rate 18, height 5\' 6"  (1.676 m), weight 69.854 kg (154 lb), SpO2 97.00%. General appearance: alert, cooperative and no distress GI: soft, non-tender; bowel sounds normal; no masses,  no organomegaly  Disposition: 01-Home or Self Care  Discharge Orders   Future Orders Complete By Expires   Discharge patient  As directed        Medication List         acetaminophen 500 MG tablet  Commonly known as:  TYLENOL  Take 1,000 mg by mouth every 6 (six) hours as needed for fever.     doxycycline 50 MG capsule  Commonly known as:  VIBRAMYCIN  Take 2 capsules (100 mg total) by mouth 2 (two) times daily.     ibuprofen 200 MG tablet  Commonly known as:  ADVIL,MOTRIN  Take 400 mg by mouth every 6 (six) hours as needed for fever or mild pain.     IRON (FERROUS GLUCONATE) PO  Take 1 tablet by mouth daily as needed (low iron).     levonorgestrel 20 MCG/24HR IUD  Commonly known as:  MIRENA  1 each by Intrauterine route once.           Follow-up Information   [facility]. (As  needed)    Contact information:   [address] [place] [id number] [phone]      Signed: Levie HeritageJacob J Shawndra Clute, DO 01/31/2014, 3:16 PM

## 2014-01-31 NOTE — Progress Notes (Signed)
Ur chart review completed.  

## 2014-01-31 NOTE — Discharge Instructions (Signed)
Pelvic Inflammatory Disease  Pelvic inflammatory disease (PID) refers to an infection in some or all of the female organs. The infection can be in the uterus, ovaries, fallopian tubes, or the surrounding tissues in the pelvis. PID can cause abdominal or pelvic pain that comes on suddenly (acute pelvic pain). PID is a serious infection because it can lead to lasting (chronic) pelvic pain or the inability to have children (infertile).   CAUSES   The infection is often caused by the normal bacteria found in the vaginal tissues. PID may also be caused by an infection that is spread during sexual contact. PID can also occur following:   · The birth of a baby.    · A miscarriage.    · An abortion.    · Major pelvic surgery.    · The use of an intrauterine device (IUD).    · A sexual assault.    RISK FACTORS  Certain factors can put a person at higher risk for PID, such as:  · Being younger than 25 years.  · Being sexually active at a young age.  · Using nonbarrier contraception.  · Having multiple sexual partners.  · Having sex with someone who has symptoms of a genital infection.  · Using oral contraception.  Other times, certain behaviors can increase the possibility of getting PID, such as:  · Having sex during your period.  · Using a vaginal douche.  · Having an intrauterine device (IUD) in place.  SYMPTOMS   · Abdominal or pelvic pain.    · Fever.    · Chills.    · Abnormal vaginal discharge.  · Abnormal uterine bleeding.    · Unusual pain shortly after finishing your period.  DIAGNOSIS   Your caregiver will choose some of the following methods to make a diagnosis, such as:   · Performing a physical exam and history. A pelvic exam typically reveals a very tender uterus and surrounding pelvis.    · Ordering laboratory tests including a pregnancy test, blood tests, and urine test.   · Ordering cultures of the vagina and cervix to check for a sexually transmitted infection (STI).  · Performing an ultrasound.     · Performing a laparoscopic procedure to look inside the pelvis.    TREATMENT   · Antibiotic medicines may be prescribed and taken by mouth.    · Sexual partners may be treated when the infection is caused by a sexually transmitted disease (STD).    · Hospitalization may be needed to give antibiotics intravenously.  · Surgery may be needed, but this is rare.  It may take weeks until you are completely well. If you are diagnosed with PID, you should also be checked for human immunodeficiency virus (HIV).    HOME CARE INSTRUCTIONS   · If given, take your antibiotics as directed. Finish the medicine even if you start to feel better.    · Only take over-the-counter or prescription medicines for pain, discomfort, or fever as directed by your caregiver.    · Do not have sexual intercourse until treatment is completed or as directed by your caregiver. If PID is confirmed, your recent sexual partner(s) will need treatment.    · Keep your follow-up appointments.  SEEK MEDICAL CARE IF:   · You have increased or abnormal vaginal discharge.    · You need prescription medicine for your pain.    · You vomit.    · You cannot take your medicines.    · Your partner has an STD.    SEEK IMMEDIATE MEDICAL CARE IF:   · You have a fever.    · You have increased abdominal or   pelvic pain.    · You have chills.    · You have pain when you urinate.    · You are not better after 72 hours following treatment.    MAKE SURE YOU:   · Understand these instructions.  · Will watch your condition.  · Will get help right away if you are not doing well or get worse.  Document Released: 10/12/2005 Document Revised: 02/06/2013 Document Reviewed: 10/08/2011  ExitCare® Patient Information ©2014 [employer].

## 2014-01-31 NOTE — Progress Notes (Signed)
Subjective: Patient reports tolerating PO and no problems voiding.  Less pain  Objective: I have reviewed patient's vital signs, medications and labs. Filed Vitals:   01/30/14 1744 01/30/14 2137 01/31/14 0100 01/31/14 0502  BP: 108/62 100/62 100/56 96/45  Pulse: 81 97 78 77  Temp: 100.1 F (37.8 C) 98.3 F (36.8 C) 97.7 F (36.5 C) 98.7 F (37.1 C)  TempSrc: Oral Oral Oral Oral  Resp: 20 18 18 18   Height:      Weight:      SpO2: 97% 95% 97% 95%    NAD Abd minimal tenderness no guarding CBC    Component Value Date/Time   WBC 4.7 01/30/2014 1320   RBC 4.43 01/30/2014 1320   HGB 13.8 01/30/2014 1320   HCT 40.2 01/30/2014 1320   PLT 232 01/30/2014 1320   MCV 90.7 01/30/2014 1320   MCH 31.2 01/30/2014 1320   MCHC 34.3 01/30/2014 1320   RDW 13.5 01/30/2014 1320   LYMPHSABS 0.8 01/30/2014 1320   MONOABS 0.6 01/30/2014 1320   EOSABS 0.0 01/30/2014 1320   BASOSABS 0.0 01/30/2014 1320      Assessment/Plan: PID, trichomonas   LOS: 1 day    Lindsey Green 01/31/2014, 7:49 AM

## 2014-01-31 NOTE — Progress Notes (Signed)
Discharge instructions reviewed with patient.  Patient states understanding of home care.  No home equipment needed.  Patient ambulated for discharge in stable condition with staff without incident. 

## 2014-08-06 ENCOUNTER — Emergency Department: Payer: Self-pay | Admitting: Emergency Medicine

## 2014-08-06 LAB — URINALYSIS, COMPLETE
BILIRUBIN, UR: NEGATIVE
BLOOD: NEGATIVE
GLUCOSE, UR: NEGATIVE mg/dL (ref 0–75)
Nitrite: NEGATIVE
Ph: 5 (ref 4.5–8.0)
Protein: NEGATIVE
Specific Gravity: 1.025 (ref 1.003–1.030)
Squamous Epithelial: 12

## 2014-08-06 LAB — WET PREP, GENITAL

## 2014-08-07 LAB — GC/CHLAMYDIA PROBE AMP

## 2014-08-27 ENCOUNTER — Encounter (HOSPITAL_COMMUNITY): Payer: Self-pay | Admitting: Emergency Medicine

## 2014-10-02 ENCOUNTER — Emergency Department: Payer: Self-pay | Admitting: Emergency Medicine

## 2014-10-02 LAB — URINALYSIS, COMPLETE
BILIRUBIN, UR: NEGATIVE
BLOOD: NEGATIVE
Bacteria: NONE SEEN
Glucose,UR: NEGATIVE mg/dL (ref 0–75)
KETONE: NEGATIVE
Nitrite: NEGATIVE
PH: 7 (ref 4.5–8.0)
Protein: NEGATIVE
RBC,UR: 1 /HPF (ref 0–5)
Specific Gravity: 1.013 (ref 1.003–1.030)
Squamous Epithelial: 2

## 2014-10-02 LAB — WET PREP, GENITAL

## 2014-10-02 LAB — GC/CHLAMYDIA PROBE AMP

## 2014-10-26 DIAGNOSIS — R87612 Low grade squamous intraepithelial lesion on cytologic smear of cervix (LGSIL): Secondary | ICD-10-CM

## 2014-10-26 HISTORY — DX: Low grade squamous intraepithelial lesion on cytologic smear of cervix (LGSIL): R87.612

## 2014-11-28 ENCOUNTER — Ambulatory Visit: Payer: Self-pay | Admitting: Nurse Practitioner

## 2015-04-29 IMAGING — US US PELVIS COMPLETE
1 series · 13 of 25 positions shown · non-contrast
Comparison: None

CLINICAL DATA: Possible pelvic inflammatory disease; history of
previous Cesarean sections; possible IUD in place



[Series 1: us pelvis complete · 0.15mm/px · 69 acquisitions, 13 frames shown]
[im 1/69]
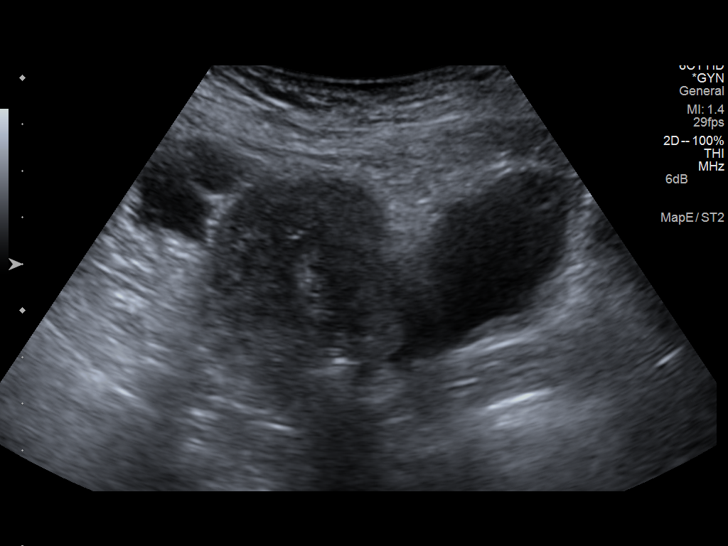
[im 6/69]
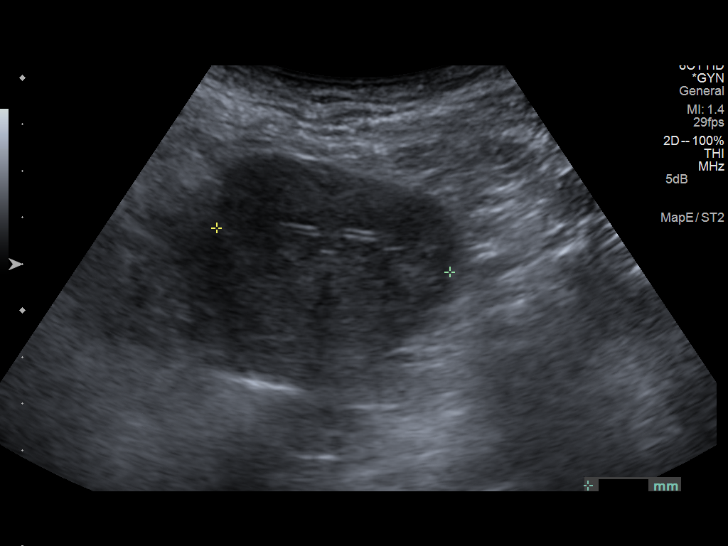
[im 12/69]
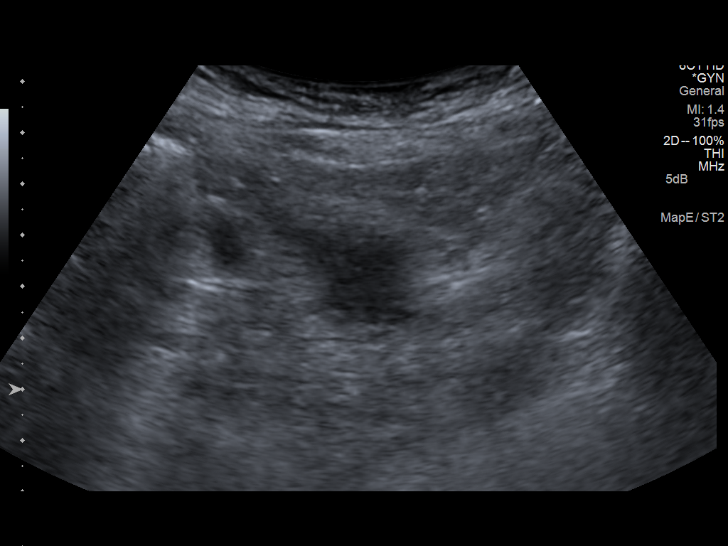
[im 18/69]
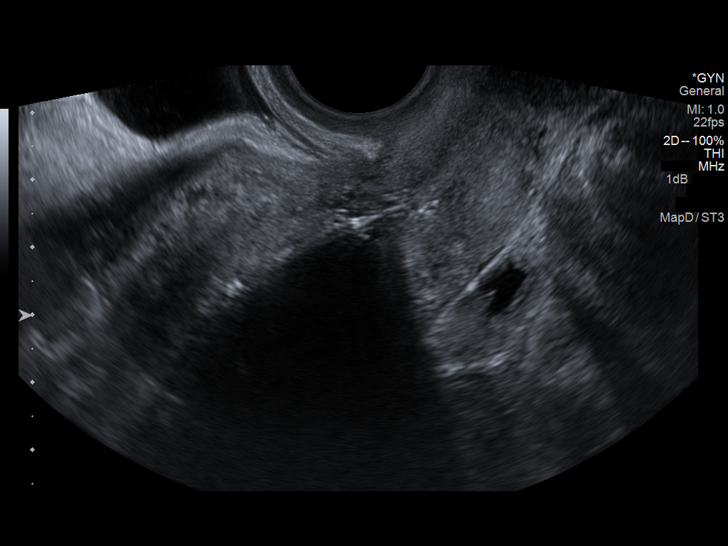
[im 23/69]
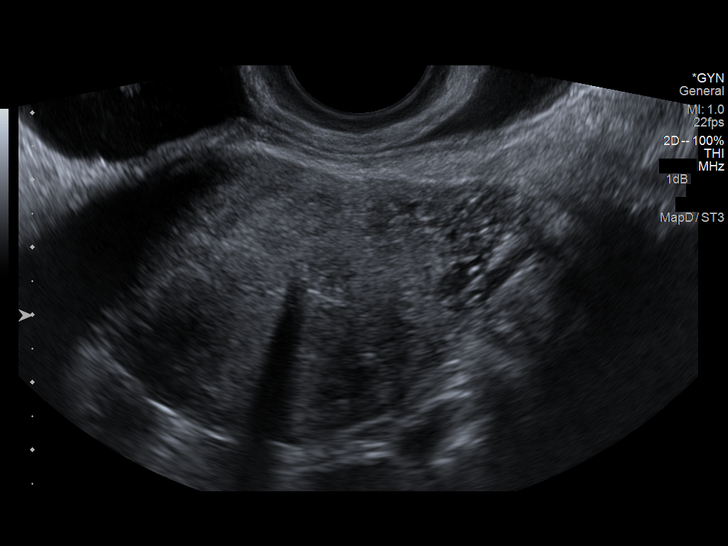
[im 29/69]
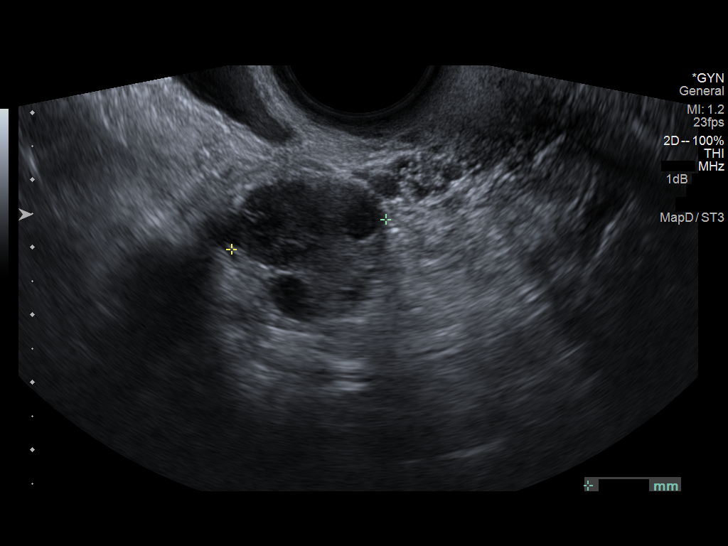
[im 35/69]
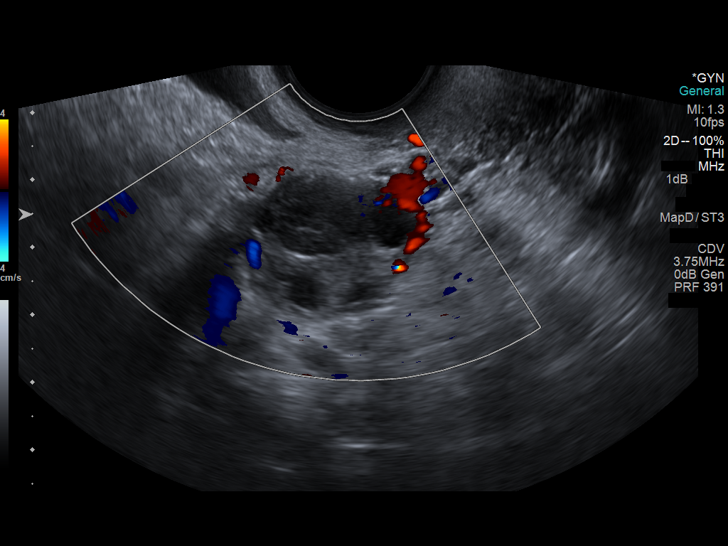
[im 40/69]
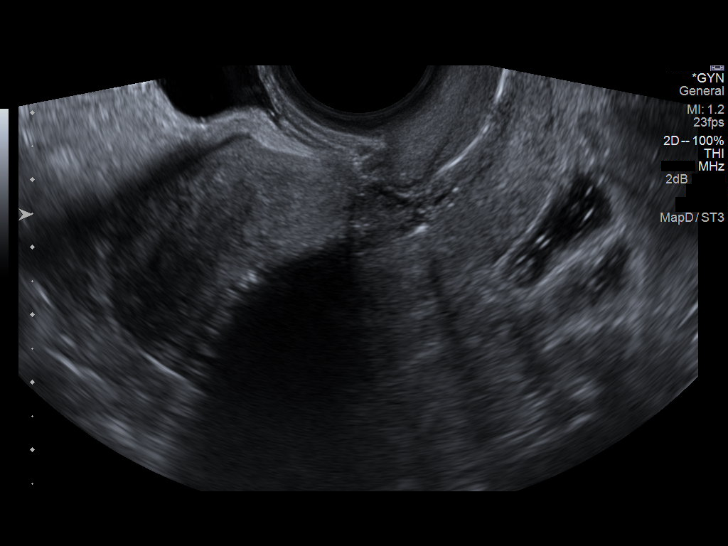
[im 46/69]
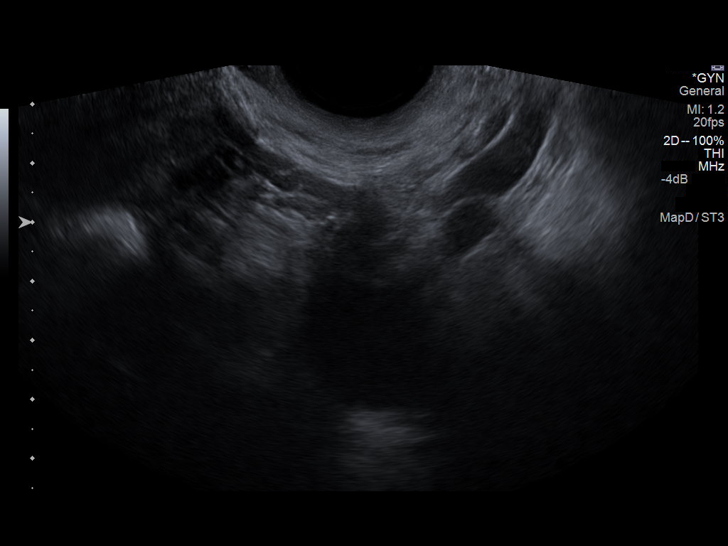
[im 52/69]
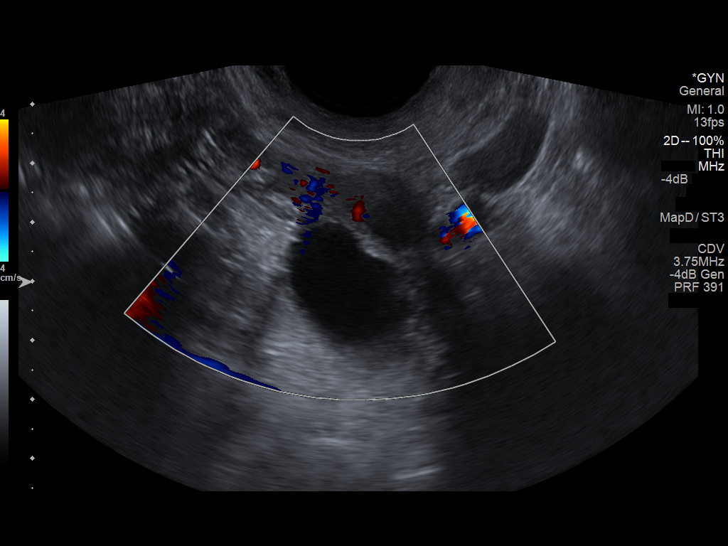
[im 57/69]
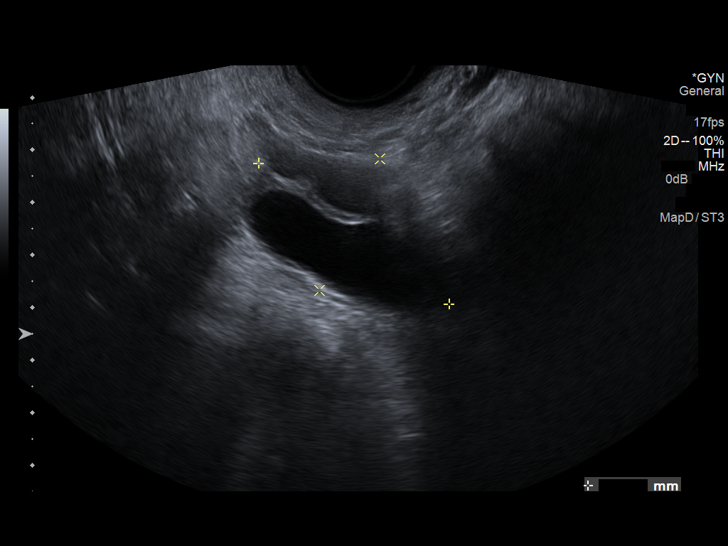
[im 63/69]
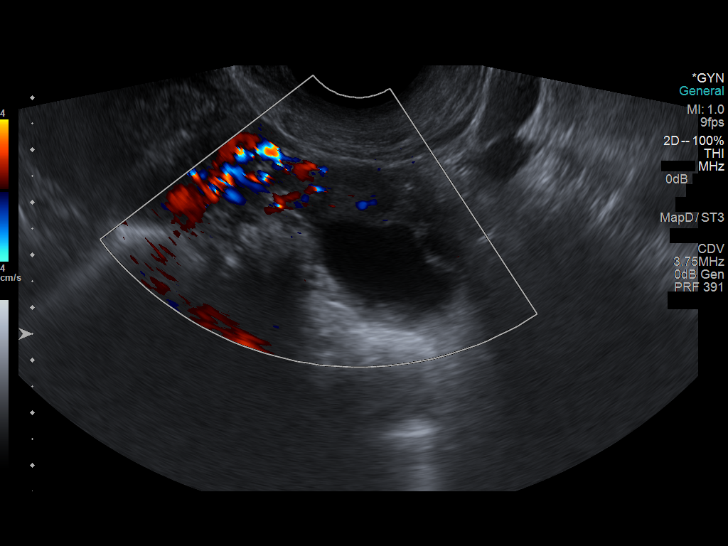
[im 69/69]
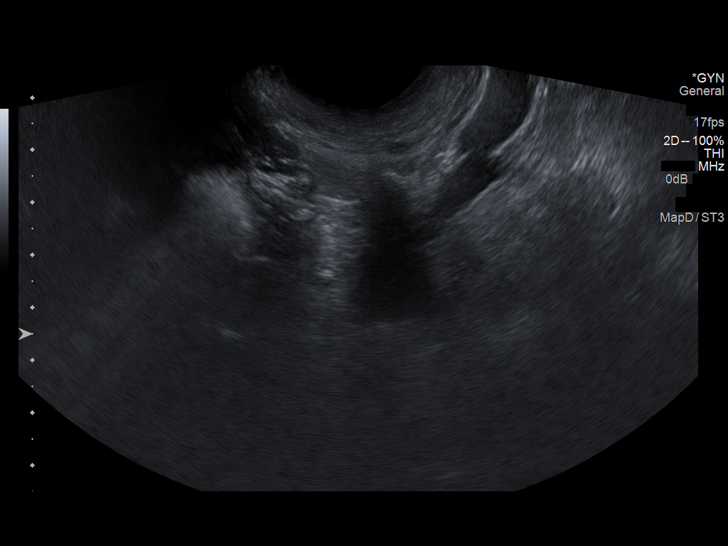

[13 of 25 positions shown; findings below may reference images not displayed]

FINDINGS: Uterus

Measurements: 7.1 x 4.2 x 5.1 cm.. An IUD is in place.

Endometrium

Thickness: 9.8 mm.  No focal abnormality visualized.

Right ovary

Measurements: 3.0 x 2.5 x 2.3 cm. In the right ovary there is
hypoechoic focus measuring 1.5 x 1.3 x 1.4 cm. This is hypoechoic
with respect to the remainder of the ovary..

Left ovary

Measurements: 4.5 x 2.7 x 2.7 cm. There is a simple appearing cyst
associated with the left ovary measuring 3.7 x 1.9 x 1.6 cm..

Other findings

No free fluid.
IMPRESSION: 1. The uterus is normal in echotexture and contour. An IUD is
present. The endometrial stripe measures just under 10 mm.
2. There is a hypoechoic focus within the right ovary measuring
cm in greatest dimension. This likely reflects a hemorrhagic cyst.
3. There is a simple appearing cyst associated with the left ovary
measuring 3.7 cm in greatest dimension.
4. There is no free fluid in the pelvis.
5. Correlation with patient's clinical and laboratory values is
needed. Followup pelvic ultrasound or MRI is available upon request.

## 2015-07-16 ENCOUNTER — Emergency Department (HOSPITAL_COMMUNITY)
Admission: EM | Admit: 2015-07-16 | Discharge: 2015-07-16 | Discharge status: Against Medical Advice | Payer: Medicaid Other | Attending: Emergency Medicine | Admitting: Emergency Medicine

## 2015-07-16 ENCOUNTER — Encounter (HOSPITAL_COMMUNITY): Payer: Self-pay

## 2015-07-16 DIAGNOSIS — N898 Other specified noninflammatory disorders of vagina: Secondary | ICD-10-CM | POA: Insufficient documentation

## 2015-07-16 DIAGNOSIS — Z720 Tobacco use: Secondary | ICD-10-CM | POA: Insufficient documentation

## 2015-07-16 NOTE — ED Notes (Signed)
MD walked in room to examine pt , pt not in room or surrounding the facility.

## 2015-07-16 NOTE — ED Notes (Signed)
Pt c/o vaginal pain, swelling, itching, and discharge x 1 week.  Pain score 9/10.  Denies odor.  Sts discharge is white.  Pt reports "I thought I was having a reaction to lubricant."

## 2015-07-16 NOTE — Progress Notes (Signed)
pcp is [facility] [id number] [place] [phone]

## 2015-08-12 ENCOUNTER — Emergency Department: Payer: Medicaid Other

## 2015-08-12 ENCOUNTER — Emergency Department
Admission: EM | Admit: 2015-08-12 | Discharge: 2015-08-12 | Disposition: A | Discharge status: Home / Self Care | Payer: Medicaid Other | Attending: Emergency Medicine | Admitting: Emergency Medicine

## 2015-08-12 DIAGNOSIS — N390 Urinary tract infection, site not specified: Secondary | ICD-10-CM | POA: Diagnosis not present

## 2015-08-12 DIAGNOSIS — N760 Acute vaginitis: Secondary | ICD-10-CM | POA: Insufficient documentation

## 2015-08-12 DIAGNOSIS — Z880 Allergy status to penicillin: Secondary | ICD-10-CM | POA: Diagnosis not present

## 2015-08-12 DIAGNOSIS — R102 Pelvic and perineal pain: Secondary | ICD-10-CM

## 2015-08-12 DIAGNOSIS — Z3202 Encounter for pregnancy test, result negative: Secondary | ICD-10-CM | POA: Diagnosis not present

## 2015-08-12 DIAGNOSIS — Z79899 Other long term (current) drug therapy: Secondary | ICD-10-CM | POA: Insufficient documentation

## 2015-08-12 DIAGNOSIS — Z720 Tobacco use: Secondary | ICD-10-CM | POA: Diagnosis not present

## 2015-08-12 DIAGNOSIS — N83201 Unspecified ovarian cyst, right side: Secondary | ICD-10-CM | POA: Diagnosis not present

## 2015-08-12 DIAGNOSIS — N83202 Unspecified ovarian cyst, left side: Secondary | ICD-10-CM | POA: Insufficient documentation

## 2015-08-12 DIAGNOSIS — B9689 Other specified bacterial agents as the cause of diseases classified elsewhere: Secondary | ICD-10-CM

## 2015-08-12 LAB — WET PREP, GENITAL
Trich, Wet Prep: NONE SEEN
Yeast Wet Prep HPF POC: NONE SEEN

## 2015-08-12 LAB — BASIC METABOLIC PANEL
ANION GAP: 5 (ref 5–15)
BUN: 16 mg/dL (ref 6–20)
CO2: 25 mmol/L (ref 22–32)
Calcium: 8.7 mg/dL — ABNORMAL LOW (ref 8.9–10.3)
Chloride: 107 mmol/L (ref 101–111)
Creatinine, Ser: 0.87 mg/dL (ref 0.44–1.00)
GFR calc Af Amer: >60 mL/min (ref >60)
GFR calc non Af Amer: >60 mL/min (ref >60)
Glucose, Bld: 90 mg/dL (ref 65–99)
POTASSIUM: 3.8 mmol/L (ref 3.5–5.1)
Sodium: 137 mmol/L (ref 135–145)

## 2015-08-12 LAB — URINALYSIS COMPLETE WITH MICROSCOPIC (ARMC ONLY)
Bilirubin Urine: NEGATIVE
Glucose, UA: NEGATIVE mg/dL
Ketones, ur: NEGATIVE mg/dL
Nitrite: NEGATIVE
PH: 7 (ref 5.0–8.0)
PROTEIN: NEGATIVE mg/dL
SPECIFIC GRAVITY, URINE: 1.006 (ref 1.005–1.030)

## 2015-08-12 LAB — POCT PREGNANCY, URINE: PREG TEST UR: NEGATIVE

## 2015-08-12 LAB — CBC WITH DIFFERENTIAL/PLATELET
BASOS ABS: 0 10*3/uL (ref 0–0.1)
Basophils Relative: 0 %
Eosinophils Absolute: 0.1 10*3/uL (ref 0–0.7)
Eosinophils Relative: 2 %
HEMATOCRIT: 40.2 % (ref 35.0–47.0)
Hemoglobin: 13.1 g/dL (ref 12.0–16.0)
LYMPHS ABS: 2.4 10*3/uL (ref 1.0–3.6)
LYMPHS PCT: 27 %
MCH: 30.2 pg (ref 26.0–34.0)
MCHC: 32.6 g/dL (ref 32.0–36.0)
MCV: 92.6 fL (ref 80.0–100.0)
MONO ABS: 0.5 10*3/uL (ref 0.2–0.9)
MONOS PCT: 5 %
NEUTROS ABS: 5.9 10*3/uL (ref 1.4–6.5)
NEUTROS PCT: 66 %
Platelets: 241 10*3/uL (ref 150–440)
RBC: 4.34 MIL/uL (ref 3.80–5.20)
RDW: 13.3 % (ref 11.5–14.5)
WBC: 9 10*3/uL (ref 3.6–11.0)

## 2015-08-12 LAB — CHLAMYDIA/NGC RT PCR (ARMC ONLY)

## 2015-08-12 MED ORDER — METRONIDAZOLE 500 MG PO TABS: 500.0000 mg | ORAL_TABLET | Freq: Two times a day (BID) | ORAL | Status: AC

## 2015-08-12 MED ORDER — TRAMADOL HCL 50 MG PO TABS
50.0000 mg | ORAL_TABLET | Freq: Once | ORAL | Status: AC
  Administered 2015-08-12: 50 mg via ORAL
  Filled 2015-08-12: qty 1

## 2015-08-12 MED ORDER — NITROFURANTOIN MONOHYD MACRO 100 MG PO CAPS: 100.0000 mg | ORAL_CAPSULE | Freq: Two times a day (BID) | ORAL | Status: DC

## 2015-08-12 MED ORDER — FLUCONAZOLE 150 MG PO TABS: 150.0000 mg | ORAL_TABLET | Freq: Once | ORAL | Status: DC

## 2015-08-12 MED ORDER — TRAMADOL HCL 50 MG PO TABS: 50.0000 mg | ORAL_TABLET | Freq: Two times a day (BID) | ORAL | Status: DC

## 2015-08-12 NOTE — ED Provider Notes (Signed)
[facility] Emergency Department Provider Note ____________________________________________  Time seen: 1110  I have reviewed the triage vital signs and the nursing notes.  HISTORY  Chief Complaint  Pelvic Pain  HPI Lindsey Green is a 34 y.o. female reports to the ED for left-sided pelvic pain and vaginal bleeding intermittently over the last 2 weeks. She reports initial onset of vaginal bleeding after intercourse about 2 weeks prior. She also noticed some intermittent vaginal discharge that isn't malodorous. She's noted bright red to dark blood utilizing one pad per day over the last 2 weeks. She has had some chills, but denies any fevers, vomiting, or diarrhea. She was recently treated about 2 weeks prior for bacterial vaginosis with Flagyl. She is also utilizing over-the-counter yeast treatment without change to her symptomology.She rates her pain at triage today at a 5/10.  Past Medical History  Diagnosis Date  . Anemia     Patient Active Problem List   Diagnosis Date Noted  . PID (acute pelvic inflammatory disease) 01/30/2014  . IUD (intrauterine device) in place 01/30/2014    Past Surgical History  Procedure Laterality Date  . Cesarean section    . Bladder repair      Current Outpatient Rx  Name  Route  Sig  Dispense  Refill  . acetaminophen (TYLENOL) 500 MG tablet   Oral   Take 1,000 mg by mouth every 6 (six) hours as needed for fever.         . fluconazole (DIFLUCAN) 150 MG tablet   Oral   Take 1 tablet (150 mg total) by mouth once.   2 tablet   0   . ibuprofen (ADVIL,MOTRIN) 200 MG tablet   Oral   Take 400 mg by mouth every 6 (six) hours as needed for fever or mild pain.         Marland Kitchen. ibuprofen (ADVIL,MOTRIN) 600 MG tablet   Oral   Take 600 mg by mouth every 6 (six) hours as needed for headache or moderate pain.         . IRON, FERROUS GLUCONATE, PO   Oral   Take 1 tablet by mouth daily as needed (low iron).         Marland Kitchen.  levonorgestrel (MIRENA) 20 MCG/24HR IUD   Intrauterine   1 each by Intrauterine route once.         . metroNIDAZOLE (FLAGYL) 500 MG tablet   Oral   Take 1 tablet (500 mg total) by mouth 2 (two) times daily.   14 tablet   0   . Multiple Vitamins-Minerals (MULTI ADULT GUMMIES PO)   Oral   Take 2 capsules by mouth daily.         . nitrofurantoin, macrocrystal-monohydrate, (MACROBID) 100 MG capsule   Oral   Take 1 capsule (100 mg total) by mouth 2 (two) times daily.   14 capsule   0   . traMADol (ULTRAM) 50 MG tablet   Oral   Take 1 tablet (50 mg total) by mouth 2 (two) times daily.   10 tablet   0    Allergies Penicillins  No family history on file.  Social History Social History  Substance Use Topics  . Smoking status: Current Every Day Smoker -- 0.00 packs/day    Types: Cigarettes  . Smokeless tobacco: None  . Alcohol Use: Yes    Review of Systems  Constitutional: Negative for fever. Eyes: Negative for visual changes. ENT: Negative for sore throat. Cardiovascular: Negative for  chest pain. Respiratory: Negative for shortness of breath. Gastrointestinal: Negative for abdominal pain, vomiting and diarrhea. Genitourinary: Negative for dysuria. Musculoskeletal: Negative for back pain. Skin: Negative for rash. Neurological: Negative for headaches, focal weakness or numbness. ____________________________________________  PHYSICAL EXAM:  VITAL SIGNS: ED Triage Vitals  Enc Vitals Group     BP 08/12/15 0932 106/52 mmHg     Pulse Rate 08/12/15 0932 95     Resp 08/12/15 0932 18     Temp 08/12/15 0932 98.2 F (36.8 C)     Temp Source 08/12/15 0932 Oral     SpO2 08/12/15 0932 100 %     Weight 08/12/15 0932 155 lb (70.308 kg)     Height 08/12/15 0932  (1.676 m)     Head Cir --      Peak Flow --      Pain Score 08/12/15 0933 5     Pain Loc --      Pain Edu? --      Excl. in GC? --    Constitutional: Alert and oriented. Well appearing and in no  distress. Head: Normocephalic and atraumatic.      Eyes: Conjunctivae are normal. PERRL. Normal extraocular movements      Ears: Canals clear. TMs intact bilaterally.   Nose: No congestion/rhinorrhea.   Mouth/Throat: Mucous membranes are moist.   Neck: Supple. No thyromegaly. Hematological/Lymphatic/Immunological: No cervical lymphadenopathy. Cardiovascular: Normal rate, regular rhythm.  Respiratory: Normal respiratory effort. No wheezes/rales/rhonchi. Gastrointestinal: Soft and nontender. No distention. GU: Patient with normal external genitalia. The cervical os is closed without appreciable visualized IUD string. She is noted to have a clear mucoid, malodorus discharge from the cervical os. There is dark blood in the vaginal vault. No adnexal mass, patient mild tender to palpation over the left adnexa. No cervical motion tenderness. Musculoskeletal: Nontender with normal range of motion in all extremities.  Neurologic:  Normal gait without ataxia. Normal speech and language. No gross focal neurologic deficits are appreciated. Skin:  Skin is warm, dry and intact. No rash noted. Psychiatric: Mood and affect are normal. Patient exhibits appropriate insight and judgment. ____________________________________________    LABS (pertinent positives/negatives) Labs Reviewed  WET PREP, GENITAL - Abnormal; Notable for the following:    Clue Cells Wet Prep HPF POC MODERATE (*)    WBC, Wet Prep HPF POC FEW (*)    All other components within normal limits  URINALYSIS COMPLETEWITH MICROSCOPIC ([facility] ONLY) - Abnormal; Notable for the following:    Color, Urine STRAW (*)    APPearance CLEAR (*)    Hgb urine dipstick 2+ (*)    Leukocytes, UA 2+ (*)    Bacteria, UA RARE (*)    Squamous Epithelial / LPF 0-5 (*)    All other components within normal limits  BASIC METABOLIC PANEL - Abnormal; Notable for the following:    Calcium 8.7 (*)    All other components within normal limits   CHLAMYDIA/NGC RT PCR ([facility] ONLY)  CBC WITH DIFFERENTIAL/PLATELET  POC URINE PREG, ED  POCT PREGNANCY, URINE  ____________________________________________   RADIOLOGY Pelvic US  IMPRESSION: No evidence of fibroids. IUD visualized within endometrial cavity.  Benign-appearing bilateral ovarian hemorrhagic cysts. No ultrasound follow-up required in a reproductive age female. This recommendation follows the consensus statement: Management of Asymptomatic Ovarian and Other Adnexal Cysts Imaged at [place]: Society of Radiologists in Ultrasound Consensus Conference Statement. Radiology 2010; [phone]. ____________________________________________  PROCEDURES  Ultram 50 mg PO ____________________________________________  INITIAL IMPRESSION / ASSESSMENT AND PLAN /  ED COURSE  Patient with acute pelvic pain secondary to bilateral ovarian cysts. She is also with a on complicated cystitis, and bacterial vaginosis. She'll be treated appropriately for her findings today. Prescriptions for Flagyl, Keflex, Ultram, and Diflucan will be provided. Patient's follow-up without [facility] ongoing GYN concerns. ____________________________________________  FINAL CLINICAL IMPRESSION(S) / ED DIAGNOSES  Final diagnoses:  Pelvic pain in female  BV (bacterial vaginosis)  UTI (lower urinary tract infection)  Cysts of both ovaries      Lissa Hoard, PA-C 08/12/15 1601  Jene Every, MD 08/14/15 1520

## 2015-08-12 NOTE — ED Notes (Signed)
Pt c/o left sided pelvic pain with vaginal bleeding since having intercourse 2 weeks ago and had bleeding immediately after..states she has an IUD

## 2015-08-12 NOTE — Discharge Instructions (Signed)
Bacterial Vaginosis Bacterial vaginosis is an infection of the vagina. It happens when too many germs (bacteria) grow in the vagina. Having this infection puts you at risk for getting other infections from sex. Treating this infection can help lower your risk for other infections, such as:   Chlamydia.  Gonorrhea.  HIV.  Herpes. HOME CARE  Take your medicine as told by your doctor.  Finish your medicine even if you start to feel better.  Tell your sex partner that you have an infection. They should see their doctor for treatment.  During treatment:  Avoid sex or use condoms correctly.  Do not douche.  Do not drink alcohol unless your doctor tells you it is ok.  Do not breastfeed unless your doctor tells you it is ok. GET HELP IF:  You are not getting better after 3 days of treatment.  You have more grey fluid (discharge) coming from your vagina than before.  You have more pain than before.  You have a fever. MAKE SURE YOU:   Understand these instructions.  Will watch your condition.  Will get help right away if you are not doing well or get worse.   This information is not intended to replace advice given to you by your health care provider. Make sure you discuss any questions you have with your health care provider.   Document Released: 07/21/2008 Document Revised: 11/02/2014 Document Reviewed: 05/24/2013 Elsevier Interactive Patient Education 2016 Elsevier Inc.  Urinary Tract Infection A urinary tract infection (UTI) can occur any place along the urinary tract. The tract includes the kidneys, ureters, bladder, and urethra. A type of germ called bacteria often causes a UTI. UTIs are often helped with antibiotic medicine.  HOME CARE   If given, take antibiotics as told by your doctor. Finish them even if you start to feel better.  Drink enough fluids to keep your pee (urine) clear or pale yellow.  Avoid tea, drinks with caffeine, and bubbly (carbonated)  drinks.  Pee often. Avoid holding your pee in for a long time.  Pee before and after having sex (intercourse).  Wipe from front to back after you poop (bowel movement) if you are a woman. Use each tissue only once. GET HELP RIGHT AWAY IF:   You have back pain.  You have lower belly (abdominal) pain.  You have chills.  You feel sick to your stomach (nauseous).  You throw up (vomit).  Your burning or discomfort with peeing does not go away.  You have a fever.  Your symptoms are not better in 3 days. MAKE SURE YOU:   Understand these instructions.  Will watch your condition.  Will get help right away if you are not doing well or get worse.   This information is not intended to replace advice given to you by your health care provider. Make sure you discuss any questions you have with your health care provider.   Document Released: 03/30/2008 Document Revised: 11/02/2014 Document Reviewed: 05/12/2012 Elsevier Interactive Patient Education 2016 Elsevier Inc.  Pelvic Pain, Female Pelvic pain is pain felt below the belly button and between your hips. It can be caused by many different things. It is important to get help right away. This is especially true for severe, sharp, or unusual pain that comes on suddenly.  HOME CARE  Only take medicine as told by your doctor.  Rest as told by your doctor.  Eat a healthy diet, such as fruits, vegetables, and lean meats.  Drink enough fluids  to keep your pee (urine) clear or pale yellow, or as told.  Avoid sex (intercourse) if it causes pain.  Apply warm or cold packs to your lower belly (abdomen). Use the type of pack that helps the pain.  Avoid situations that cause you stress.  Keep a journal to track your pain. Write down:  When the pain started.  Where it is located.  If there are things that seem to be related to the pain, such as food or your period.  Follow up with your doctor as told. GET HELP RIGHT AWAY IF:    You have heavy bleeding from the vagina.  You have more pelvic pain.  You feel lightheaded or pass out (faint).  You have chills.  You have pain when you pee or have blood in your pee.  You cannot stop having watery poop (diarrhea).  You cannot stop throwing up (vomiting).  You have a fever or lasting symptoms for more than 3 days.  You have a fever and your symptoms suddenly get worse.  You are being physically or sexually abused.  Your medicine does not help your pain.  You have fluid (discharge) coming from your vagina that is not normal. MAKE SURE YOU:  Understand these instructions.  Will watch your condition.  Will get help if you are not doing well or get worse.   This information is not intended to replace advice given to you by your health care provider. Make sure you discuss any questions you have with your health care provider.   Document Released: 03/30/2008 Document Revised: 11/02/2014 Document Reviewed: 02/01/2012 Elsevier Interactive Patient Education 2016 [employer]  Take the prescription meds as directed. [facility].

## 2017-04-28 IMAGING — US US PELVIS COMPLETE
1 series · 13 of 25 positions shown · non-contrast
Comparison: 10/02/2014

CLINICAL DATA: Left-sided pelvic pain and bleeding for 2 weeks
which began after intercourse. IUD.

EXAM:
TRANSABDOMINAL AND TRANSVAGINAL ULTRASOUND OF PELVIS
TECHNIQUE: Both transabdominal and transvaginal ultrasound examinations of the
pelvis were performed. Transabdominal technique was performed for
global imaging of the pelvis including uterus, ovaries, adnexal
regions, and pelvic cul-de-sac. It was necessary to proceed with
endovaginal exam following the transabdominal exam to visualize the
endometrium and small bilateral ovarian cysts.

[Series 1: us pelvis complete · 0.22mm/px · 13 of 92 slices shown]
[im 1/92]
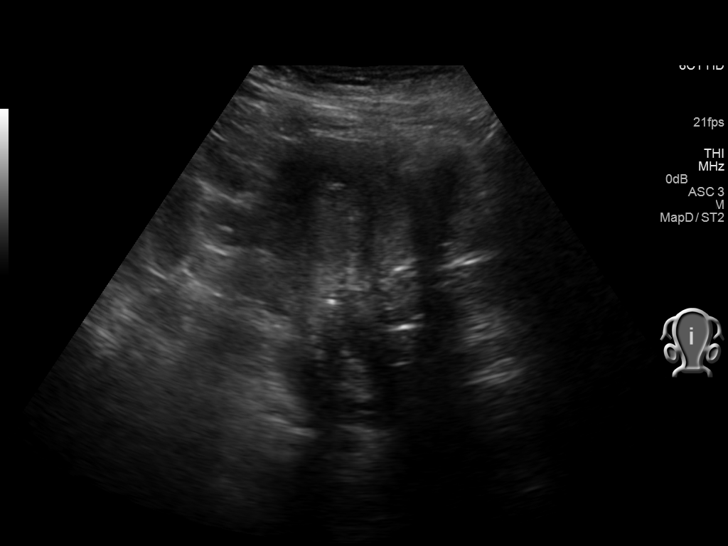
[im 8/92]
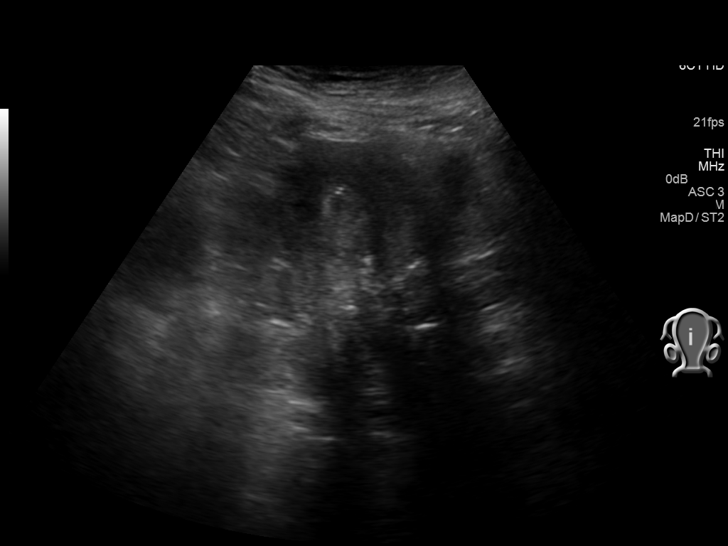
[im 16/92]
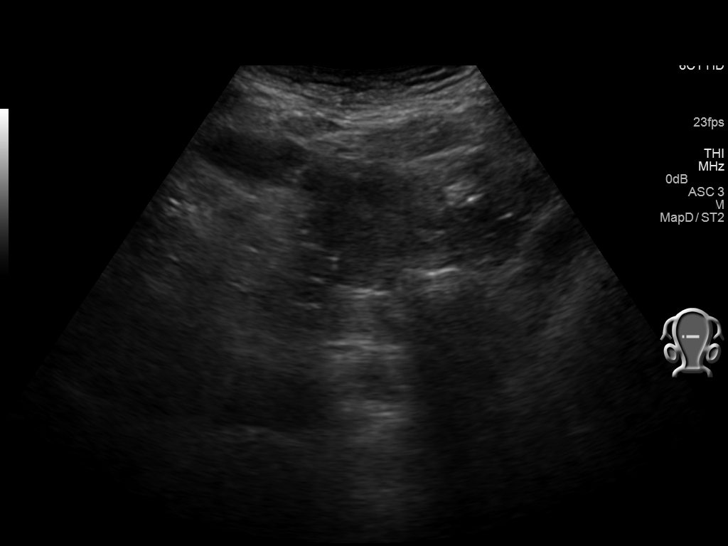
[im 23/92]
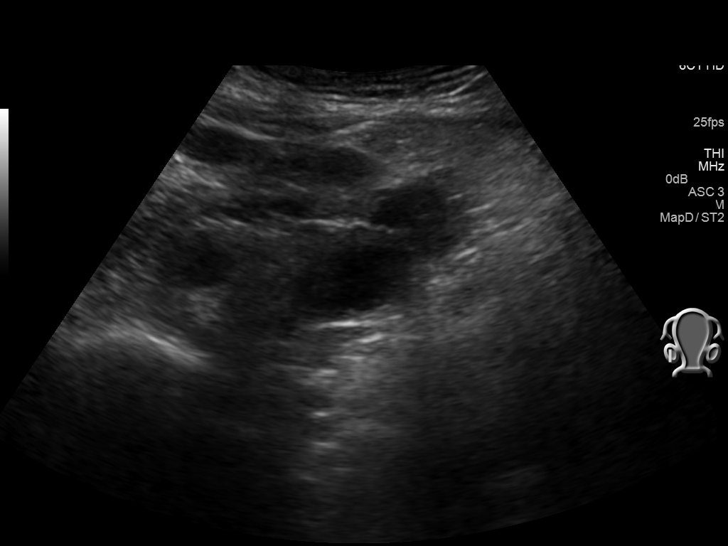
[im 31/92]
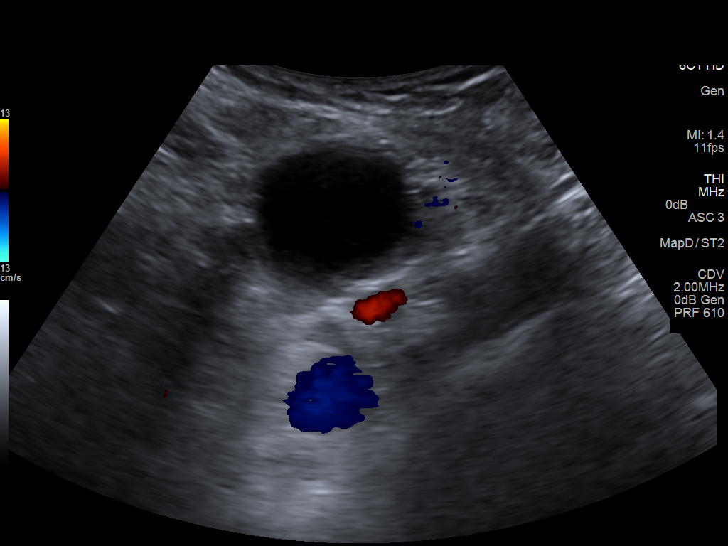
[im 38/92]
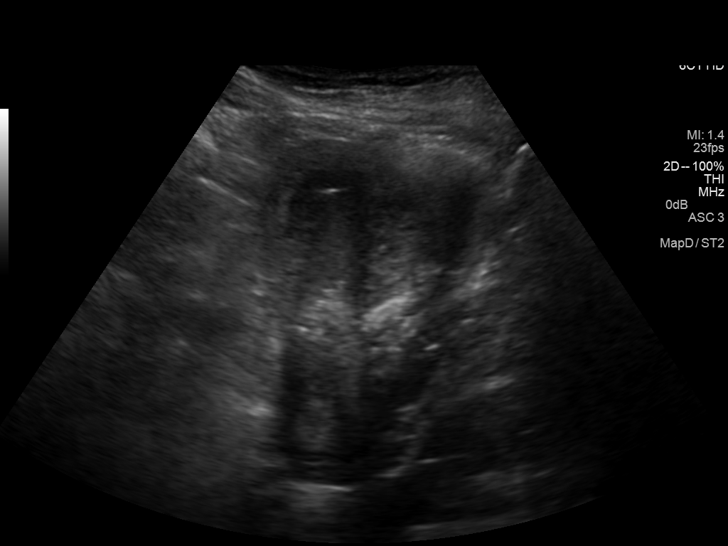
[im 46/92]
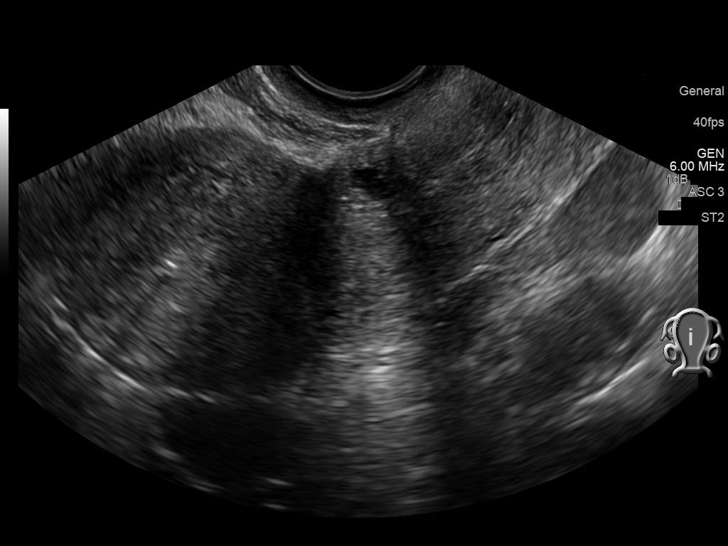
[im 54/92]
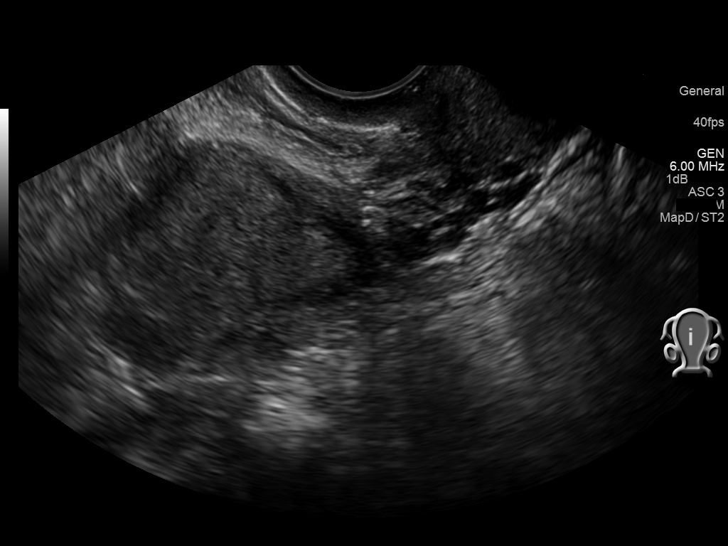
[im 61/92]
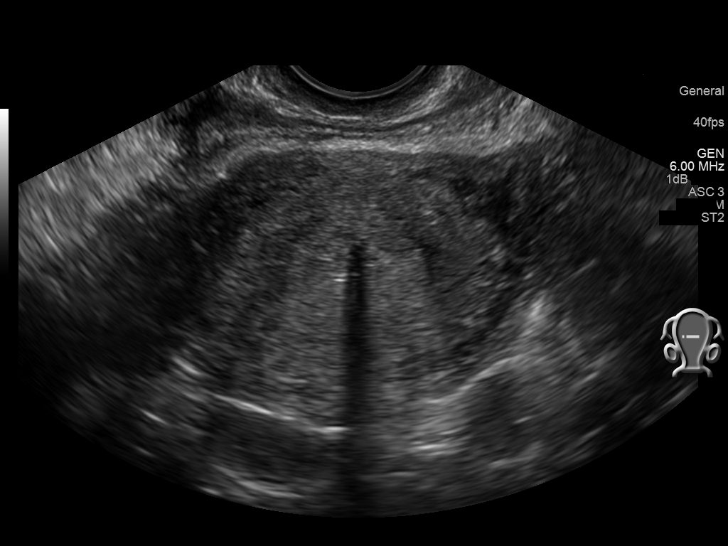
[im 69/92]
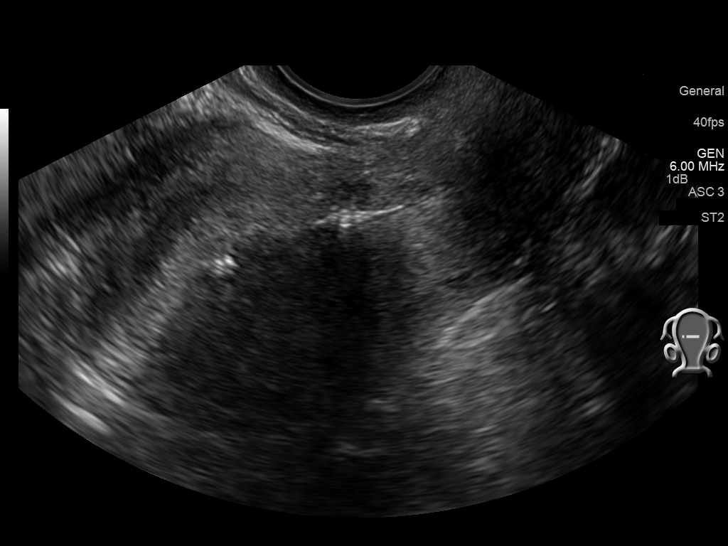
[im 76/92]
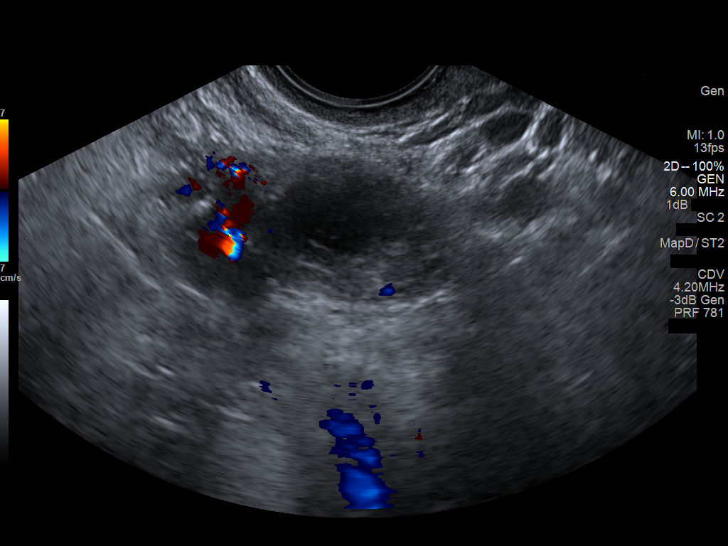
[im 84/92]
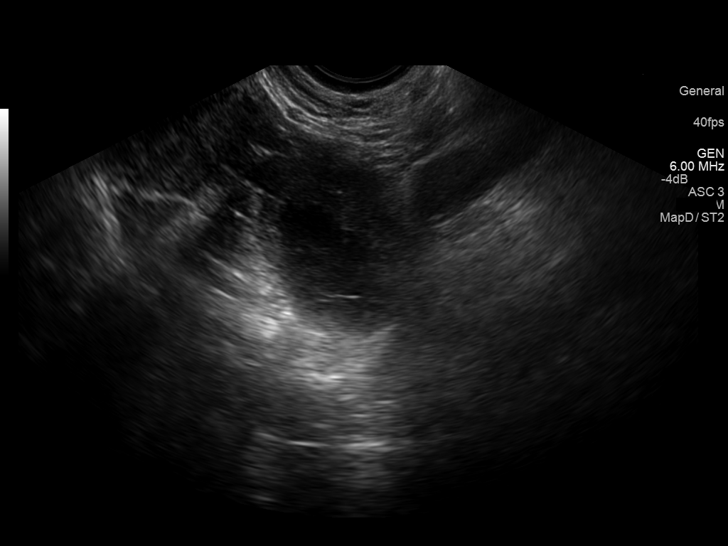
[im 92/92]
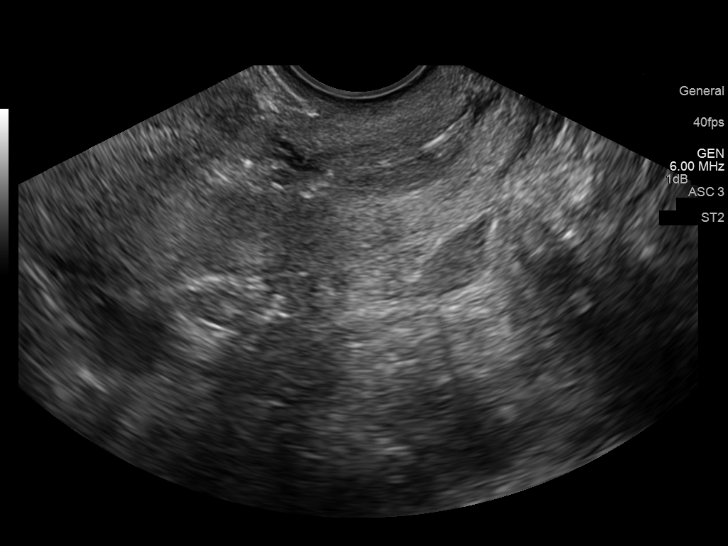

[13 of 25 positions shown; findings below may reference images not displayed]

FINDINGS: Uterus

Measurements: 9.3 x 4.7 x 6.1 cm. No fibroids or other mass
visualized.

Endometrium

Thickness: 13 mm. IUD new seen in expected position in the
endometrial cavity.

Right ovary

Measurements: 2.9 x 2.0 x 2.5 cm. 2.0 cm cyst with fine internal
reticular echo pattern seen, consistent with a hemorrhagic cyst.

Left ovary

Measurements: 4.6 x 3.8 x 4.0 cm. 3.6 cm cyst with fine internal
reticular echo pattern is seen, consistent with a hemorrhagic
ovarian cyst.

Other findings

No free fluid.
IMPRESSION: No evidence of fibroids.  IUD visualized within endometrial cavity.

Benign-appearing bilateral ovarian hemorrhagic cysts. No ultrasound
follow-up required in a reproductive age female. This recommendation
follows the consensus statement: Management of Asymptomatic Ovarian
and Other Adnexal Cysts Imaged at [place]: Society of Radiologists in

## 2018-04-12 ENCOUNTER — Ambulatory Visit (INDEPENDENT_AMBULATORY_CARE_PROVIDER_SITE_OTHER): Payer: Medicaid Other | Admitting: Obstetrics and Gynecology

## 2018-04-12 ENCOUNTER — Encounter: Payer: Self-pay | Admitting: Obstetrics and Gynecology

## 2018-04-12 VITALS — BP 120/80 | Ht 66.0 in | Wt 179.0 lb

## 2018-04-12 DIAGNOSIS — R87810 Cervical high risk human papillomavirus (HPV) DNA test positive: Secondary | ICD-10-CM

## 2018-04-12 DIAGNOSIS — Z30432 Encounter for removal of intrauterine contraceptive device: Secondary | ICD-10-CM

## 2018-04-12 DIAGNOSIS — Z1151 Encounter for screening for human papillomavirus (HPV): Secondary | ICD-10-CM | POA: Diagnosis not present

## 2018-04-12 DIAGNOSIS — Z8041 Family history of malignant neoplasm of ovary: Secondary | ICD-10-CM | POA: Diagnosis not present

## 2018-04-12 DIAGNOSIS — Z124 Encounter for screening for malignant neoplasm of cervix: Secondary | ICD-10-CM | POA: Diagnosis not present

## 2018-04-12 DIAGNOSIS — Z975 Presence of (intrauterine) contraceptive device: Secondary | ICD-10-CM

## 2018-04-12 DIAGNOSIS — R87612 Low grade squamous intraepithelial lesion on cytologic smear of cervix (LGSIL): Secondary | ICD-10-CM

## 2018-04-12 NOTE — Progress Notes (Addendum)
PCP:  System, Provider Not In   Chief Complaint  Patient presents with  . Contraception    pt neeeds a pap     HPI:      Ms. Lindsey Green is a 37 y.o. [phone] who LMP was No LMP recorded., presents today for NP pap and IUD removal, referred by PCP, Dr. Yves Dill.  Pt had LGSIL pap/pos HPV DNA 2016 but never had colpo. Needs repeat today.   Pt had Mirena placed 3/14 and is due for removal. Pt is amenorrheic. No sex active and doesn't plan to be again. Doesn't want replacement.   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   There is no FH of breast cancer. There is a poss FH of ovarian cancer in her mat aunt. Genetic testing not done.   Labs with PCP.   Past Medical History:  Diagnosis Date  . Anemia   . LGSIL on Pap smear of cervix 2016   no colpo    Past Surgical History:  Procedure Laterality Date  . BLADDER REPAIR    . CESAREAN SECTION    . INCISIONAL HERNIA REPAIR     bladder repair with mesh; CAN NEVER HAVE ABD SURG AGAIN     Family History  Problem Relation Age of Onset  . Hypotension Mother   . Ovarian cancer Maternal Aunt        ? age  . Breast cancer Neg Hx     Social History   Socioeconomic History  . Marital status: Single    Spouse name: Not on file  . Number of children: Not on file  . Years of education: Not on file  . Highest education level: Not on file  Occupational History  . Not on file  Social Needs  . Financial resource strain: Not on file  . Food insecurity:    Worry: Not on file    Inability: Not on file  . Transportation needs:    Medical: Not on file    Non-medical: Not on file  Tobacco Use  . Smoking status: Current Every Day Smoker    Packs/day: 0.00    Types: Cigarettes  . Smokeless tobacco: Never Used  Substance and Sexual Activity  . Alcohol use: Yes  . Drug use: Yes    Frequency: 7.0 times per week    Types: Marijuana  . Sexual activity: Yes    Birth control/protection:  IUD  Lifestyle  . Physical activity:    Days per week: Not on file    Minutes per session: Not on file  . Stress: Not on file  Relationships  . Social connections:    Talks on phone: Not on file    Gets together: Not on file    Attends religious service: Not on file    Active member of club or organization: Not on file    Attends meetings of clubs or organizations: Not on file    Relationship status: Not on file  . Intimate partner violence:    Fear of current or ex partner: Not on file    Emotionally abused: Not on file    Physically abused: Not on file    Forced sexual activity: Not on file  Other Topics Concern  . Not on file  Social History Narrative  . Not on file    Outpatient Medications Prior to Visit  Medication Sig Dispense Refill  . acetaminophen (TYLENOL)  500 MG tablet Take 1,000 mg by mouth every 6 (six) hours as needed for fever.    . fluconazole (DIFLUCAN) 150 MG tablet Take 1 tablet (150 mg total) by mouth once. (Patient not taking: Reported on 04/12/2018) 2 tablet 0  . ibuprofen (ADVIL,MOTRIN) 200 MG tablet Take 400 mg by mouth every 6 (six) hours as needed for fever or mild pain.    Marland Kitchen. ibuprofen (ADVIL,MOTRIN) 600 MG tablet Take 600 mg by mouth every 6 (six) hours as needed for headache or moderate pain.    . IRON, FERROUS GLUCONATE, PO Take 1 tablet by mouth daily as needed (low iron).    Marland Kitchen. levonorgestrel (MIRENA) 20 MCG/24HR IUD 1 each by Intrauterine route once.    . Multiple Vitamins-Minerals (MULTI ADULT GUMMIES PO) Take 2 capsules by mouth daily.    . nitrofurantoin, macrocrystal-monohydrate, (MACROBID) 100 MG capsule Take 1 capsule (100 mg total) by mouth 2 (two) times daily. (Patient not taking: Reported on 04/12/2018) 14 capsule 0  . traMADol (ULTRAM) 50 MG tablet Take 1 tablet (50 mg total) by mouth 2 (two) times daily. (Patient not taking: Reported on 04/12/2018) 10 tablet 0   No facility-administered medications prior to visit.     ROS:  Review of  Systems  Constitutional: Negative for fatigue, fever and unexpected weight change.  Respiratory: Negative for cough, shortness of breath and wheezing.   Cardiovascular: Negative for chest pain, palpitations and leg swelling.  Gastrointestinal: Negative for blood in stool, constipation, diarrhea, nausea and vomiting.  Endocrine: Negative for cold intolerance, heat intolerance and polyuria.  Genitourinary: Negative for dyspareunia, dysuria, flank pain, frequency, genital sores, hematuria, menstrual problem, pelvic pain, urgency, vaginal bleeding, vaginal discharge and vaginal pain.  Musculoskeletal: Negative for back pain, joint swelling and myalgias.  Skin: Negative for rash.  Neurological: Negative for dizziness, syncope, light-headedness, numbness and headaches.  Hematological: Negative for adenopathy.  Psychiatric/Behavioral: Negative for agitation, confusion, sleep disturbance and suicidal ideas. The patient is not nervous/anxious.    BREAST: No symptoms   Objective: BP 120/80   Ht 5\' 6"  (1.676 m)   Wt 179 lb (81.2 kg)   BMI 28.89 kg/m    Physical Exam  Genitourinary: There is no rash, tenderness, lesion or Bartholin's cyst on the right labia. There is no rash, tenderness, lesion or Bartholin's cyst on the left labia. No erythema, tenderness or bleeding in the vagina. No vaginal discharge found. Right adnexum does not display mass and does not display tenderness. Left adnexum does not display mass and does not display tenderness. Cervix does not exhibit motion tenderness, discharge or friability. Uterus is not enlarged or tender. Rectal exam shows external hemorrhoid.  Genitourinary Comments: IUD STRINGS NOT VISIBLE  Nursing note and vitals reviewed.  IUD Removal IUD removed without problem with [place] forceps.  Pt tolerated this well.  IUD noted to be intact.  Assessment:  Cervical cancer screening - Plan: IGP, Aptima HPV  Screening for HPV (human papillomavirus) - Plan: IGP,  Aptima HPV  LGSIL on Pap smear of cervix - Plan: IGP, Aptima HPV  Cervical high risk human papillomavirus (HPV) DNA test positive - Plan: IGP, Aptima HPV  Encounter for removal of intrauterine contraceptive device (IUD)  Family history of ovarian cancer - Pt to clarify dx. If ovar ca, pt qualifies for cancer genetic testing.  IUD (intrauterine device) in place             F/U  Return if symptoms worsen or fail to improve.  Helmut MusterAlicia  B. Copland, PA-C 04/12/2018 10:40 AM

## 2018-04-12 NOTE — Patient Instructions (Signed)
I value your feedback and entrusting us with your care. If you get a [facility] patient survey, I would appreciate you taking the time to let us know about your experience today. Thank you! 

## 2018-04-15 LAB — IGP, APTIMA HPV
HPV Aptima: NEGATIVE
PAP Smear Comment: 0

## 2018-05-27 ENCOUNTER — Encounter: Payer: Self-pay | Admitting: Emergency Medicine

## 2018-05-27 ENCOUNTER — Emergency Department
Admission: EM | Admit: 2018-05-27 | Discharge: 2018-05-27 | Disposition: A | Discharge status: Home / Self Care | Payer: Medicaid Other | Attending: Emergency Medicine | Admitting: Emergency Medicine

## 2018-05-27 ENCOUNTER — Other Ambulatory Visit: Payer: Self-pay

## 2018-05-27 DIAGNOSIS — Z79899 Other long term (current) drug therapy: Secondary | ICD-10-CM | POA: Insufficient documentation

## 2018-05-27 DIAGNOSIS — F17210 Nicotine dependence, cigarettes, uncomplicated: Secondary | ICD-10-CM | POA: Diagnosis not present

## 2018-05-27 DIAGNOSIS — L910 Hypertrophic scar: Secondary | ICD-10-CM | POA: Insufficient documentation

## 2018-05-27 DIAGNOSIS — L089 Local infection of the skin and subcutaneous tissue, unspecified: Secondary | ICD-10-CM | POA: Insufficient documentation

## 2018-05-27 DIAGNOSIS — H60391 Other infective otitis externa, right ear: Secondary | ICD-10-CM

## 2018-05-27 MED ORDER — FLUCONAZOLE 150 MG PO TABS: 150.0000 mg | ORAL_TABLET | Freq: Every day | ORAL | 0 refills | Status: DC

## 2018-05-27 MED ORDER — SULFAMETHOXAZOLE-TRIMETHOPRIM 800-160 MG PO TABS: 1.0000 | ORAL_TABLET | Freq: Two times a day (BID) | ORAL | 0 refills | Status: DC

## 2018-05-27 MED ORDER — NEOMYCIN-POLYMYXIN-PRAMOXINE 1 % EX CREA: TOPICAL_CREAM | Freq: Two times a day (BID) | CUTANEOUS | 0 refills | Status: DC

## 2018-05-27 NOTE — ED Provider Notes (Signed)
[facility] Emergency Department Provider Note   ____________________________________________   First MD Initiated Contact with Patient 05/27/18 [phone]     (approximate)  I have reviewed the triage vital signs and the nursing notes.   HISTORY  Chief Complaint Recurrent Skin Infections    HPI Lindsey Green is a 37 y.o. female patient presents papular lesions behind both ears right greater than left.  Patient state this is a recurrent condition.  Patient state she was referred to a dermatologist but they did not accept Medicaid.  Patient denies drainage at this time.  Patient denies fever.  Patient rates the pain discomfort a 4/10.  Patient described her pain is "achy".  No palliative measure for complaint.  Past Medical History:  Diagnosis Date  . Anemia   . LGSIL on Pap smear of cervix 2016   no colpo    Patient Active Problem List   Diagnosis Date Noted  . LGSIL on Pap smear of cervix 04/12/2018  . Cervical high risk human papillomavirus (HPV) DNA test positive 04/12/2018  . PID (acute pelvic inflammatory disease) 01/30/2014  . IUD (intrauterine device) in place 01/30/2014    Past Surgical History:  Procedure Laterality Date  . BLADDER REPAIR    . CESAREAN SECTION    . INCISIONAL HERNIA REPAIR     bladder repair with mesh; CAN NEVER HAVE ABD SURG AGAIN     Prior to Admission medications   Medication Sig Start Date End Date Taking? Authorizing Provider  acetaminophen (TYLENOL) 500 MG tablet Take 1,000 mg by mouth every 6 (six) hours as needed for fever.    [provider]  fluconazole (DIFLUCAN) 150 MG tablet Take 1 tablet (150 mg total) by mouth once. Patient not taking: Reported on 04/12/2018 08/12/15   Menshew, Charlesetta IvoryJenise V Bacon, PA-C  fluconazole (DIFLUCAN) 150 MG tablet Take 1 tablet (150 mg total) by mouth daily. 05/27/18   Joni ReiningSmith, Ronald K, PA-C  ibuprofen (ADVIL,MOTRIN) 200 MG tablet Take 400 mg by mouth every 6 (six) hours as  needed for fever or mild pain.    [provider]  ibuprofen (ADVIL,MOTRIN) 600 MG tablet Take 600 mg by mouth every 6 (six) hours as needed for headache or moderate pain.    [provider]  IRON, FERROUS GLUCONATE, PO Take 1 tablet by mouth daily as needed (low iron).    [provider]  levonorgestrel (MIRENA) 20 MCG/24HR IUD 1 each by Intrauterine route once.    [provider]  Multiple Vitamins-Minerals (MULTI ADULT GUMMIES PO) Take 2 capsules by mouth daily.    [provider]  neomycin-polymyxin-pramoxine (NEOSPORIN PLUS) 1 % cream Apply topically 2 (two) times daily. 05/27/18   Joni ReiningSmith, Ronald K, PA-C  nitrofurantoin, macrocrystal-monohydrate, (MACROBID) 100 MG capsule Take 1 capsule (100 mg total) by mouth 2 (two) times daily. Patient not taking: Reported on 04/12/2018 08/12/15   Menshew, Charlesetta IvoryJenise V Bacon, PA-C  sulfamethoxazole-trimethoprim (BACTRIM DS,SEPTRA DS) 800-160 MG tablet Take 1 tablet by mouth 2 (two) times daily. 05/27/18   Joni ReiningSmith, Ronald K, PA-C  traMADol (ULTRAM) 50 MG tablet Take 1 tablet (50 mg total) by mouth 2 (two) times daily. Patient not taking: Reported on 04/12/2018 08/12/15   Menshew, Charlesetta IvoryJenise V Bacon, PA-C    Allergies Penicillins  Family History  Problem Relation Age of Onset  . Hypotension Mother   . Ovarian cancer Maternal Aunt        ? age  . Breast cancer Neg Hx  Social History Social History   Tobacco Use  . Smoking status: Current Every Day Smoker    Packs/day: 0.00    Types: Cigarettes  . Smokeless tobacco: Never Used  Substance Use Topics  . Alcohol use: Yes  . Drug use: Yes    Frequency: 7.0 times per week    Types: Marijuana    Review of Systems Constitutional: No fever/chills Eyes: No visual changes. ENT: No sore throat. Cardiovascular: Denies chest pain. Respiratory: Denies shortness of breath. Gastrointestinal: No abdominal pain.  No nausea, no vomiting.  No diarrhea.  No  constipation. Genitourinary: Negative for dysuria. Musculoskeletal: Negative for back pain. Skin redness and swelling behind both ears... Neurological: Negative for headaches, focal weakness or numbness. Allergic/Immunilogical: Penicillin. ____________________________________________   PHYSICAL EXAM:  VITAL SIGNS: ED Triage Vitals  Enc Vitals Group     BP 05/27/18 0726 124/79     Pulse --      Resp 05/27/18 0726 18     Temp 05/27/18 0726 97.9 F (36.6 C)     Temp Source 05/27/18 0726 Oral     SpO2 05/27/18 0726 100 %     Weight 05/27/18 0723 185 lb (83.9 kg)     Height 05/27/18 0723 5\' 6"  (1.676 m)     Head Circumference --      Peak Flow --      Pain Score 05/27/18 0722 4     Pain Loc --      Pain Edu? --      Excl. in GC? --    Constitutional: Alert and oriented. Well appearing and in no acute distress. Hematological/Lymphatic/Immunilogical: No cervical lymphadenopathy. Cardiovascular: Normal rate, regular rhythm. Grossly normal heart sounds.  Good peripheral circulation. Respiratory: Normal respiratory effort.  No retractions. Lungs CTAB. Neurologic:  Normal speech and language. No gross focal neurologic deficits are appreciated. No gait instability. Skin: Erythematous edematous papular lesion posterior bilateral ear.  Psychiatric: Mood and affect are normal. Speech and behavior are normal.  ____________________________________________   LABS (all labs ordered are listed, but only abnormal results are displayed)  Labs Reviewed - No data to display ____________________________________________  EKG   ____________________________________________  RADIOLOGY  ED MD interpretation:    Official radiology report(s): No results found.  ____________________________________________   PROCEDURES  Procedure(s) performed: None  Procedures  Critical Care performed: No  ____________________________________________   INITIAL IMPRESSION / ASSESSMENT AND PLAN /  ED COURSE  As part of my medical decision making, I reviewed the following data within the electronic MEDICAL RECORD NUMBER    Infected keloid lesions bilateral ear.  Patient given discharge care instruction.  Patient started on Keflex.  Patient advised to contact dermatology to determine if their office will accept medical cane and she will arrange for evaluation and treatment.      ____________________________________________   FINAL CLINICAL IMPRESSION(S) / ED DIAGNOSES  Final diagnoses:  Keloid  Skin of right earlobe with infection     ED Discharge Orders        Ordered    sulfamethoxazole-trimethoprim (BACTRIM DS,SEPTRA DS) 800-160 MG tablet  2 times daily     05/27/18 0746    neomycin-polymyxin-pramoxine (NEOSPORIN PLUS) 1 % cream  2 times daily     05/27/18 0746    fluconazole (DIFLUCAN) 150 MG tablet  Daily     05/27/18 0746       Note:  This document was prepared using Dragon voice recognition software and may include unintentional dictation errors.    Lindsey Green,  Arther Abbott, PA-C 05/27/18 3086    Sharyn Creamer, MD 06/06/18 [phone]

## 2018-05-27 NOTE — ED Triage Notes (Signed)
Pt states abscess behind right ear with pain and slight swelling.

## 2018-05-27 NOTE — ED Notes (Signed)
See triage note  Presents with possible infection behind right eye  Min swelling and increased pain with palpation  No fever

## 2018-05-27 NOTE — Discharge Instructions (Signed)
Advised to contact dermatologist to determine if they will accept her insurance before scheduling appointment.

## 2018-07-07 ENCOUNTER — Other Ambulatory Visit: Payer: Self-pay

## 2018-07-07 ENCOUNTER — Encounter: Payer: Self-pay | Admitting: Emergency Medicine

## 2018-07-07 ENCOUNTER — Emergency Department
Admission: EM | Admit: 2018-07-07 | Discharge: 2018-07-07 | Disposition: A | Discharge status: Home / Self Care | Payer: Medicaid Other | Attending: Emergency Medicine | Admitting: Emergency Medicine

## 2018-07-07 ENCOUNTER — Emergency Department: Payer: Medicaid Other

## 2018-07-07 DIAGNOSIS — B9789 Other viral agents as the cause of diseases classified elsewhere: Secondary | ICD-10-CM | POA: Diagnosis not present

## 2018-07-07 DIAGNOSIS — F17210 Nicotine dependence, cigarettes, uncomplicated: Secondary | ICD-10-CM | POA: Insufficient documentation

## 2018-07-07 DIAGNOSIS — J069 Acute upper respiratory infection, unspecified: Secondary | ICD-10-CM | POA: Diagnosis not present

## 2018-07-07 DIAGNOSIS — R05 Cough: Secondary | ICD-10-CM | POA: Diagnosis present

## 2018-07-07 DIAGNOSIS — Z79899 Other long term (current) drug therapy: Secondary | ICD-10-CM | POA: Insufficient documentation

## 2018-07-07 MED ORDER — BENZONATATE 100 MG PO CAPS: 200.0000 mg | ORAL_CAPSULE | Freq: Three times a day (TID) | ORAL | 0 refills | Status: AC | PRN

## 2018-07-07 MED ORDER — FEXOFENADINE-PSEUDOEPHED ER 60-120 MG PO TB12: 1.0000 | ORAL_TABLET | Freq: Two times a day (BID) | ORAL | 0 refills | Status: AC

## 2018-07-07 NOTE — ED Triage Notes (Addendum)
Patient ambulatory to triage with steady gait, without difficulty or distress noted; pt reports prod cough white sputum since last week with sinus congestion; pt here with mother & daughter also to be seen for same symptoms

## 2018-07-07 NOTE — ED Provider Notes (Signed)
[facility] Emergency Department Provider Note   ____________________________________________   None    (approximate)  I have reviewed the triage vital signs and the nursing notes.   HISTORY  Chief Complaint Cough    HPI Lindsey Green is a 37 y.o. female patient presents with sinus congestion and productive cough for 1 week.  Patient state she is here with family members with the same complaint.  Patient denies nausea, vomiting, diarrhea.  Past Medical History:  Diagnosis Date  . Anemia   . LGSIL on Pap smear of cervix 2016   no colpo    Patient Active Problem List   Diagnosis Date Noted  . LGSIL on Pap smear of cervix 04/12/2018  . Cervical high risk human papillomavirus (HPV) DNA test positive 04/12/2018  . PID (acute pelvic inflammatory disease) 01/30/2014  . IUD (intrauterine device) in place 01/30/2014    Past Surgical History:  Procedure Laterality Date  . BLADDER REPAIR    . CESAREAN SECTION    . INCISIONAL HERNIA REPAIR     bladder repair with mesh; CAN NEVER HAVE ABD SURG AGAIN     Prior to Admission medications   Medication Sig Start Date End Date Taking? Authorizing Provider  acetaminophen (TYLENOL) 500 MG tablet Take 1,000 mg by mouth every 6 (six) hours as needed for fever.    [provider]  benzonatate (TESSALON PERLES) 100 MG capsule Take 2 capsules (200 mg total) by mouth 3 (three) times daily as needed. 07/07/18 07/07/19  Joni Reining, PA-C  fexofenadine-pseudoephedrine (ALLEGRA-D) 60-120 MG 12 hr tablet Take 1 tablet by mouth 2 (two) times daily. 07/07/18   Joni Reining, PA-C  fluconazole (DIFLUCAN) 150 MG tablet Take 1 tablet (150 mg total) by mouth once. Patient not taking: Reported on 04/12/2018 08/12/15   Menshew, Charlesetta Ivory, PA-C  fluconazole (DIFLUCAN) 150 MG tablet Take 1 tablet (150 mg total) by mouth daily. 05/27/18   Joni Reining, PA-C  ibuprofen (ADVIL,MOTRIN) 200 MG tablet Take 400 mg by  mouth every 6 (six) hours as needed for fever or mild pain.    [provider]  ibuprofen (ADVIL,MOTRIN) 600 MG tablet Take 600 mg by mouth every 6 (six) hours as needed for headache or moderate pain.    [provider]  IRON, FERROUS GLUCONATE, PO Take 1 tablet by mouth daily as needed (low iron).    [provider]  levonorgestrel (MIRENA) 20 MCG/24HR IUD 1 each by Intrauterine route once.    [provider]  Multiple Vitamins-Minerals (MULTI ADULT GUMMIES PO) Take 2 capsules by mouth daily.    [provider]  neomycin-polymyxin-pramoxine (NEOSPORIN PLUS) 1 % cream Apply topically 2 (two) times daily. 05/27/18   Joni Reining, PA-C  nitrofurantoin, macrocrystal-monohydrate, (MACROBID) 100 MG capsule Take 1 capsule (100 mg total) by mouth 2 (two) times daily. Patient not taking: Reported on 04/12/2018 08/12/15   Menshew, Charlesetta Ivory, PA-C  sulfamethoxazole-trimethoprim (BACTRIM DS,SEPTRA DS) 800-160 MG tablet Take 1 tablet by mouth 2 (two) times daily. 05/27/18   Joni Reining, PA-C  traMADol (ULTRAM) 50 MG tablet Take 1 tablet (50 mg total) by mouth 2 (two) times daily. Patient not taking: Reported on 04/12/2018 08/12/15   Menshew, Charlesetta Ivory, PA-C    Allergies Penicillins  Family History  Problem Relation Age of Onset  . Hypotension Mother   . Ovarian cancer Maternal Aunt        ? age  .  Breast cancer Neg Hx     Social History Social History   Tobacco Use  . Smoking status: Current Every Day Smoker    Packs/day: 0.00    Types: Cigarettes  . Smokeless tobacco: Never Used  Substance Use Topics  . Alcohol use: Yes  . Drug use: Yes    Frequency: 7.0 times per week    Types: Marijuana    Review of Systems Constitutional: No fever/chills Eyes: No visual changes. ENT: No sore throat.  Nasal congestion Cardiovascular: Denies chest pain. Respiratory: Denies shortness of breath.  Productive cough. Gastrointestinal: No abdominal  pain.  No nausea, no vomiting.  No diarrhea.  No constipation. Genitourinary: Negative for dysuria. Musculoskeletal: Negative for back pain. Skin: Negative for rash. Neurological: Negative for headaches, focal weakness or numbness.  Allergic/Immunilogical: Penicillin. ____________________________________________   PHYSICAL EXAM:  VITAL SIGNS: ED Triage Vitals  Enc Vitals Group     BP 07/07/18 0651 101/60     Pulse Rate 07/07/18 0651 68     Resp 07/07/18 0651 18     Temp 07/07/18 0651 98.4 F (36.9 C)     Temp Source 07/07/18 0651 Oral     SpO2 07/07/18 0651 99 %     Weight 07/07/18 0642 190 lb (86.2 kg)     Height 07/07/18 0642 5\' 6"  (1.676 m)     Head Circumference --      Peak Flow --      Pain Score 07/07/18 0642 0     Pain Loc --      Pain Edu? --      Excl. in GC? --     Constitutional: Alert and oriented. Well appearing and in no acute distress. Eyes: Conjunctivae are normal. PERRL. EOMI. Head: Atraumatic. Nose: Edematous nasal turbinates clear rhinorrhea. Mouth/Throat: Mucous membranes are moist.  Oropharynx non-erythematous.  Postnasal drainage. Neck: No stridor.  Cardiovascular: Normal rate, regular rhythm. Grossly normal heart sounds.  Good peripheral circulation. Respiratory: Normal respiratory effort.  No retractions. Lungs CTAB. Gastrointestinal: Soft and nontender. No distention. No abdominal bruits. No CVA tenderness. Musculoskeletal: No lower extremity tenderness nor edema.  No joint effusions. Neurologic:  Normal speech and language. No gross focal neurologic deficits are appreciated. No gait instability. Skin:  Skin is warm, dry and intact. No rash noted. Psychiatric: Mood and affect are normal. Speech and behavior are normal.  ____________________________________________   LABS (all labs ordered are listed, but only abnormal results are displayed)  Labs Reviewed - No data to  display ____________________________________________  EKG   ____________________________________________  RADIOLOGY  ED MD interpretation:    Official radiology report(s): Dg Chest 2 View  Result Date: 07/07/2018 CLINICAL DATA:  Cough EXAM: CHEST - 2 VIEW COMPARISON:  01/30/2014 FINDINGS: Heart and mediastinal contours are within normal limits. No focal opacities or effusions. No acute bony abnormality. IMPRESSION: No active cardiopulmonary disease. Electronically Signed   By: Charlett NoseKevin  Dover M.D.   On: 07/07/2018 07:36    ____________________________________________   PROCEDURES  Procedure(s) performed: None  Procedures  Critical Care performed: No  ____________________________________________   INITIAL IMPRESSION / ASSESSMENT AND PLAN / ED COURSE  As part of my medical decision making, I reviewed the following data within the electronic MEDICAL RECORD NUMBER    Viral respiratory infection with cough.  Discussed negative x-ray findings with patient.  Patient given discharge care instruction advised take medication as directed.      ____________________________________________   FINAL CLINICAL IMPRESSION(S) / ED DIAGNOSES  Final diagnoses:  Viral URI with cough     ED Discharge Orders         Ordered    fexofenadine-pseudoephedrine (ALLEGRA-D) 60-120 MG 12 hr tablet  2 times daily     07/07/18 0800    benzonatate (TESSALON PERLES) 100 MG capsule  3 times daily PRN     07/07/18 0800           Note:  This document was prepared using Dragon voice recognition software and may include unintentional dictation errors.    Joni Reining, PA-C 07/07/18 1525    Arnaldo Natal, MD 07/09/18 [phone]

## 2018-07-07 NOTE — ED Notes (Addendum)
See triage note  Presents with occasional prod cough   sxs' started about 4-5 days  ago afebrile on arrival  States her daughter was dx;'d with the flu  She is only complianig of some cough and sore throat

## 2019-10-04 IMAGING — CR DG CHEST 2V
1 series · 1 of 1 positions shown · non-contrast
Comparison: 01/30/2014

CLINICAL DATA: Cough

EXAM:
CHEST - 2 VIEW

[chest lat]
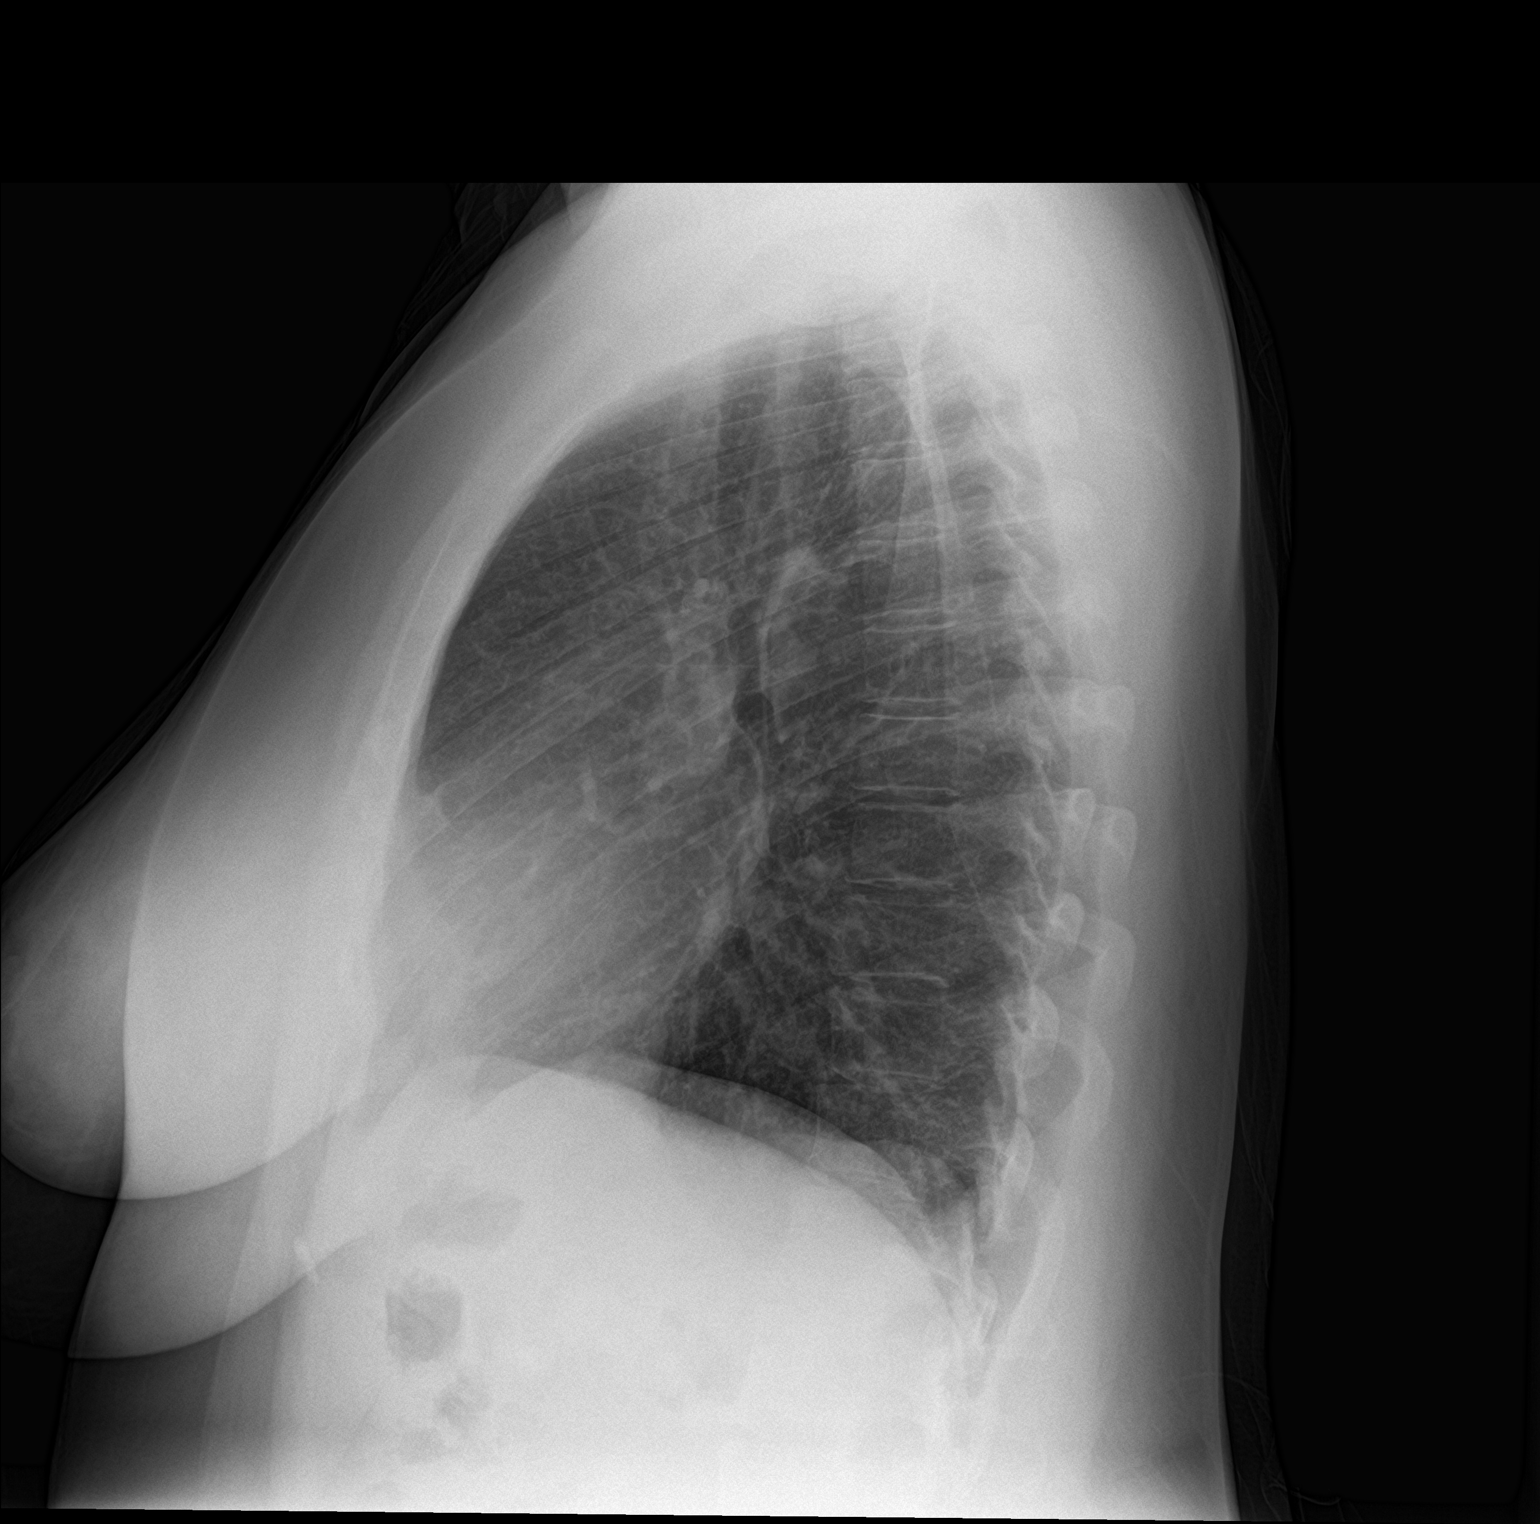

[1 of 1 positions shown; findings below may reference images not displayed]

FINDINGS: Heart and mediastinal contours are within normal limits. No focal
opacities or effusions. No acute bony abnormality.
IMPRESSION: No active cardiopulmonary disease.

## 2020-07-09 ENCOUNTER — Ambulatory Visit: Payer: Medicaid Other | Admitting: Student in an Organized Health Care Education/Training Program

## 2021-03-31 DIAGNOSIS — H66011 Acute suppurative otitis media with spontaneous rupture of ear drum, right ear: Secondary | ICD-10-CM

## 2021-03-31 HISTORY — DX: Acute suppurative otitis media with spontaneous rupture of ear drum, right ear: H66.011

## 2021-05-19 ENCOUNTER — Other Ambulatory Visit: Payer: Self-pay

## 2021-05-19 ENCOUNTER — Emergency Department
Admission: EM | Admit: 2021-05-19 | Discharge: 2021-05-19 | Disposition: A | Discharge status: Home / Self Care | Payer: Medicaid Other | Attending: Emergency Medicine | Admitting: Emergency Medicine

## 2021-05-19 DIAGNOSIS — H9393 Unspecified disorder of ear, bilateral: Secondary | ICD-10-CM | POA: Insufficient documentation

## 2021-05-19 DIAGNOSIS — Z5321 Procedure and treatment not carried out due to patient leaving prior to being seen by health care provider: Secondary | ICD-10-CM | POA: Diagnosis not present

## 2021-05-19 NOTE — ED Triage Notes (Signed)
Pt here with bilateral ear pressure. Pt had surgery last Friday and the right ear is not healing as properly and has a lot more pressure than the left ear.

## 2021-05-27 DIAGNOSIS — T8131XA Disruption of external operation (surgical) wound, not elsewhere classified, initial encounter: Secondary | ICD-10-CM | POA: Insufficient documentation

## 2021-05-27 HISTORY — DX: Disruption of external operation (surgical) wound, not elsewhere classified, initial encounter: T81.31XA

## 2022-12-21 ENCOUNTER — Ambulatory Visit: Payer: Medicaid Other | Admitting: Internal Medicine

## 2023-03-11 ENCOUNTER — Ambulatory Visit: Payer: Medicaid Other | Admitting: Family

## 2023-03-11 ENCOUNTER — Telehealth: Payer: Self-pay

## 2023-03-11 ENCOUNTER — Encounter: Payer: Self-pay | Admitting: Family

## 2023-03-11 VITALS — BP 118/80 | HR 97 | Ht 66.5 in | Wt 149.8 lb

## 2023-03-11 DIAGNOSIS — J301 Allergic rhinitis due to pollen: Secondary | ICD-10-CM | POA: Insufficient documentation

## 2023-03-11 DIAGNOSIS — M25531 Pain in right wrist: Secondary | ICD-10-CM | POA: Diagnosis not present

## 2023-03-11 DIAGNOSIS — M25551 Pain in right hip: Secondary | ICD-10-CM | POA: Diagnosis not present

## 2023-03-11 DIAGNOSIS — G8929 Other chronic pain: Secondary | ICD-10-CM

## 2023-03-11 DIAGNOSIS — N390 Urinary tract infection, site not specified: Secondary | ICD-10-CM

## 2023-03-11 DIAGNOSIS — J019 Acute sinusitis, unspecified: Secondary | ICD-10-CM | POA: Diagnosis not present

## 2023-03-11 DIAGNOSIS — M25552 Pain in left hip: Secondary | ICD-10-CM

## 2023-03-11 MED ORDER — IBUPROFEN 400 MG PO TABS: 400.0000 mg | ORAL_TABLET | Freq: Four times a day (QID) | ORAL | 3 refills | Status: DC | PRN

## 2023-03-11 MED ORDER — CLINDAMYCIN HCL 300 MG PO CAPS: 300.0000 mg | ORAL_CAPSULE | Freq: Three times a day (TID) | ORAL | 0 refills | Status: AC

## 2023-03-11 MED ORDER — HYDROCODONE-ACETAMINOPHEN 10-325 MG PO TABS: 1.0000 | ORAL_TABLET | Freq: Four times a day (QID) | ORAL | 0 refills | Status: AC | PRN

## 2023-03-11 MED ORDER — NITROFURANTOIN MONOHYD MACRO 100 MG PO CAPS: 100.0000 mg | ORAL_CAPSULE | Freq: Two times a day (BID) | ORAL | 0 refills | Status: DC

## 2023-03-11 MED ORDER — FLUCONAZOLE 150 MG PO TABS: 150.0000 mg | ORAL_TABLET | Freq: Every day | ORAL | 0 refills | Status: DC

## 2023-03-11 MED ORDER — EPINEPHRINE 0.15 MG/0.15ML IJ SOAJ: 0.1500 mg | INTRAMUSCULAR | 3 refills | Status: AC | PRN

## 2023-03-11 NOTE — Progress Notes (Signed)
Established Patient Office Visit  Subjective:  Patient ID: Lindsey Green, female    DOB: 1981-10-08  Age: 42 y.o. MRN: [id number]  Chief Complaint  Patient presents with   Follow-up    Follow up    Patient is here for follow up.  She has been having difficulty getting in touch with her pain clinic, and asks if she might be referred to a different clinic.  She also has been ahving some pain in her right wrist.   Finally, she asks if she can get refills for her medications.     No other concerns at this time.   Past Medical History:  Diagnosis Date   Anemia    LGSIL on Pap smear of cervix 2016   no colpo   Non-recurrent acute suppurative otitis media of right ear with spontaneous rupture of tympanic membrane 03/31/2021   Last Assessment & Plan: Formatting of this note might be different from the original. Recent right ear discomfort and drainage. Currently finishing up treatment for the above.  She is using topical drops. EXAM shows moist debris in the right ear that was cleaned out meticulously under the microscope revealing otherwise normal external canal with intact tympanic membrane. PLAN: Looks like she is he   Surgical wound dehiscence 05/27/2021   Last Assessment & Plan: Formatting of this note might be different from the original. Concern over postauricular wounds. 2 weeks out from excision of postauricular infections.  Subsequently has developed dehiscence of the wound on both sides.  She has been cleaning the wound with peroxide and applying topical antibiotic ointment.  She also has been on oral antibiotics.  The area is tender. EXAMINA    Past Surgical History:  Procedure Laterality Date   BLADDER REPAIR     CESAREAN SECTION     INCISIONAL HERNIA REPAIR     bladder repair with mesh; CAN NEVER HAVE ABD SURG AGAIN     Social History   Socioeconomic History   Marital status: Single    Spouse name: Not on file   Number of children: Not on file   Years of  education: Not on file   Highest education level: Not on file  Occupational History   Not on file  Tobacco Use   Smoking status: Every Day    Packs/day: 0    Types: Cigarettes   Smokeless tobacco: Never  Vaping Use   Vaping Use: Never used  Substance and Sexual Activity   Alcohol use: Yes   Drug use: Yes    Frequency: 7.0 times per week    Types: Marijuana   Sexual activity: Yes    Birth control/protection: I.U.D.  Other Topics Concern   Not on file  Social History Narrative   Not on file   Social Determinants of Health   Financial Resource Strain: Not on file  Food Insecurity: Not on file  Transportation Needs: Not on file  Physical Activity: Not on file  Stress: Not on file  Social Connections: Not on file  Intimate Partner Violence: Not on file    Family History  Problem Relation Age of Onset   Hypotension Mother    Ovarian cancer Maternal Aunt        ? age   Breast cancer Neg Hx     Allergies  Allergen Reactions   Fire Ant Anaphylaxis   Penicillins Anaphylaxis and Rash    Has patient had a PCN reaction causing immediate rash, facial/tongue/throat swelling, SOB or lightheadedness with  hypotension: Yes  Has patient had a PCN reaction causing severe rash involving mucus membranes or skin necrosis: Yes  Has patient had a PCN reaction that required hospitalization Yes- went to ed  Has patient had a PCN reaction occurring within the last 10 years: No- was a childhood allergy  If all of the above answers are "NO", then may proceed with Cephalosporin use.  Has patient had a PCN reaction causing immediate rash, facial/tongue/throat swelling, SOB or lightheadedness with hypotension: Yes  Has patient had a PCN reaction causing severe rash involving mucus membranes or skin necrosis: Yes  Has patient had a PCN reaction that required hospitalization Yes- went to ed  Has patient had a PCN reaction occurring within the last 10 years: No- was a childhood allergy  If all  of the above answers are "NO", then may proceed with Cephalosporin use.  Has patient had a PCN reaction causing immediate rash, facial/tongue/throat swelling, SOB or lightheadedness with hypotension: Yes  Has patient had a PCN reaction causing severe rash involving mucus membranes or skin necrosis: Yes  Has patient had a PCN reaction that required hospitalization Yes- went to ed  Has patient had a PCN reaction occurring within the last 10 years: No- was a childhood allergy  If all of the above answers are "NO", then may proceed with Cephalosporin use.  Has patient had a PCN reaction causing immediate rash, facial/tongue/throat swelling, SOB or lightheadedness with hypotension: Yes  Has patient had a PCN reaction causing severe rash involving mucus membranes or skin necrosis: Yes  Has patient had a PCN reaction that required hospitalization Yes- went to ed  Has patient had a PCN reaction occurring within the last 10 years: No- was a childhood allergy  If all of the above answers are "NO", then may proceed with Cephalosporin use.  Has patient had a PCN reaction causing immediate rash, facial/tongue/throat swelling, SOB or lightheadedness with hypotension: Yes, Has patient had a PCN reaction causing severe rash involving mucus membranes or skin necrosis: Yes, Has patient had a PCN reaction that required hospitalization Yes- went to ed, Has patient had a PCN reaction occurring within the last 10 years: No- was a childhood allergy, If all of the above answers are "NO", then may proceed with Cephalosporin use., Has patient had a PCN   Ciprofloxacin Other (See Comments)    Review of Systems  Musculoskeletal:  Positive for joint pain.  All other systems reviewed and are negative.      Objective:   BP 118/80   Pulse 97   Ht 5' 6.5" (1.689 m)   Wt 149 lb 12.8 oz (67.9 kg)   SpO2 98%   BMI 23.82 kg/m   Vitals:   03/11/23 1313  BP: 118/80  Pulse: 97  Height: 5' 6.5" (1.689 m)  Weight: 149  lb 12.8 oz (67.9 kg)  SpO2: 98%  BMI (Calculated): 23.82    Physical Exam Vitals and nursing note reviewed.  Constitutional:      Appearance: Normal appearance. She is normal weight.  HENT:     Head: Normocephalic.  Eyes:     Extraocular Movements: Extraocular movements intact.     Conjunctiva/sclera: Conjunctivae normal.     Pupils: Pupils are equal, round, and reactive to light.  Cardiovascular:     Rate and Rhythm: Normal rate.     Pulses: Normal pulses.  Pulmonary:     Effort: Pulmonary effort is normal.     Breath sounds: Normal breath sounds.  Neurological:  General: No focal deficit present.     Mental Status: She is alert and oriented to person, place, and time. Mental status is at baseline.  Psychiatric:        Mood and Affect: Mood normal.        Behavior: Behavior normal.        Thought Content: Thought content normal.        Judgment: Judgment normal.      No results found for any visits on 03/11/23.  No results found for this or any previous visit (from the past 2160 hour(s)).     Assessment & Plan:   Problem List Items Addressed This Visit       Active Problems   Chronic pain of both hips - Primary   Relevant Medications   ibuprofen (ADVIL) 400 MG tablet   HYDROcodone-acetaminophen (NORCO) 10-325 MG tablet   Other Relevant Orders   Ambulatory referral to Pain Clinic   Other Visit Diagnoses     Urinary tract infection without hematuria, site unspecified       Relevant Medications   fluconazole (DIFLUCAN) 150 MG tablet   clindamycin (CLEOCIN) 300 MG capsule   nitrofurantoin, macrocrystal-monohydrate, (MACROBID) 100 MG capsule   Wrist pain, acute, right       Acute non-recurrent sinusitis, unspecified location       Relevant Medications   fluconazole (DIFLUCAN) 150 MG tablet   clindamycin (CLEOCIN) 300 MG capsule   nitrofurantoin, macrocrystal-monohydrate, (MACROBID) 100 MG capsule   Seasonal allergic rhinitis due to pollen            Return in about 1 month (around 04/11/2023) for F/U.   Total time spent: 30 minutes  Miki Kins, FNP  03/11/2023   This document may have been prepared by [facility] Voice Recognition software and as such may include unintentional dictation errors.

## 2023-03-11 NOTE — Telephone Encounter (Signed)
Mandy with [facility] called asking for a new rx to be called in for the patient for the EPI Pen states that she needs the one that does not has Adrenoclick on it, it can be the same dose but has to be different rx

## 2023-03-17 DIAGNOSIS — M25531 Pain in right wrist: Secondary | ICD-10-CM | POA: Insufficient documentation

## 2023-03-24 ENCOUNTER — Ambulatory Visit: Payer: Medicaid Other | Admitting: Family

## 2023-04-12 ENCOUNTER — Ambulatory Visit: Payer: Medicaid Other | Admitting: Family

## 2023-04-12 ENCOUNTER — Encounter: Payer: Self-pay | Admitting: Family

## 2023-04-12 VITALS — BP 110/70 | HR 81 | Ht 66.5 in | Wt 154.2 lb

## 2023-04-12 DIAGNOSIS — R5383 Other fatigue: Secondary | ICD-10-CM

## 2023-04-12 DIAGNOSIS — R7303 Prediabetes: Secondary | ICD-10-CM | POA: Diagnosis not present

## 2023-04-12 DIAGNOSIS — I10 Essential (primary) hypertension: Secondary | ICD-10-CM | POA: Diagnosis not present

## 2023-04-12 DIAGNOSIS — Z202 Contact with and (suspected) exposure to infections with a predominantly sexual mode of transmission: Secondary | ICD-10-CM

## 2023-04-12 DIAGNOSIS — J301 Allergic rhinitis due to pollen: Secondary | ICD-10-CM

## 2023-04-12 DIAGNOSIS — E039 Hypothyroidism, unspecified: Secondary | ICD-10-CM

## 2023-04-12 DIAGNOSIS — E538 Deficiency of other specified B group vitamins: Secondary | ICD-10-CM

## 2023-04-12 DIAGNOSIS — E782 Mixed hyperlipidemia: Secondary | ICD-10-CM

## 2023-04-12 DIAGNOSIS — E559 Vitamin D deficiency, unspecified: Secondary | ICD-10-CM

## 2023-04-12 MED ORDER — HYDROXYZINE HCL 10 MG PO TABS: 10.0000 mg | ORAL_TABLET | Freq: Three times a day (TID) | ORAL | 0 refills | Status: AC | PRN

## 2023-04-12 MED ORDER — HYDROCODONE-ACETAMINOPHEN 5-325 MG PO TABS: 1.0000 | ORAL_TABLET | ORAL | 0 refills | Status: AC | PRN

## 2023-04-12 MED ORDER — PREDNISONE 10 MG (21) PO TBPK: ORAL_TABLET | ORAL | 0 refills | Status: DC

## 2023-04-24 ENCOUNTER — Encounter: Payer: Self-pay | Admitting: Family

## 2023-04-24 DIAGNOSIS — E559 Vitamin D deficiency, unspecified: Secondary | ICD-10-CM | POA: Insufficient documentation

## 2023-04-24 DIAGNOSIS — Z202 Contact with and (suspected) exposure to infections with a predominantly sexual mode of transmission: Secondary | ICD-10-CM | POA: Insufficient documentation

## 2023-04-24 NOTE — Progress Notes (Signed)
Established Patient Office Visit  Subjective:  Patient ID: Lindsey Green, female    DOB: [birth year]  Age: 42 y.o. MRN: [id number]  Chief Complaint  Patient presents with   Follow-up    1 month follow up    Patient is here today for her 1 month follow up.  She has been feeling fairly well since last appointment.   She does not have additional concerns to discuss today.  Labs are due today. She needs refills.   I have reviewed her active problem list, medication list, allergies, notes from last encounter, lab results for her appointment today.    No other concerns at this time.   Past Medical History:  Diagnosis Date   Anemia    LGSIL on Pap smear of cervix 2016   no colpo   Non-recurrent acute suppurative otitis media of right ear with spontaneous rupture of tympanic membrane 03/31/2021   Last Assessment & Plan: Formatting of this note might be different from the original. Recent right ear discomfort and drainage. Currently finishing up treatment for the above.  She is using topical drops. EXAM shows moist debris in the right ear that was cleaned out meticulously under the microscope revealing otherwise normal external canal with intact tympanic membrane. PLAN: Looks like she is he   Surgical wound dehiscence 05/27/2021   Last Assessment & Plan: Formatting of this note might be different from the original. Concern over postauricular wounds. 2 weeks out from excision of postauricular infections.  Subsequently has developed dehiscence of the wound on both sides.  She has been cleaning the wound with peroxide and applying topical antibiotic ointment.  She also has been on oral antibiotics.  The area is tender. EXAMINA    Past Surgical History:  Procedure Laterality Date   BLADDER REPAIR     CESAREAN SECTION     INCISIONAL HERNIA REPAIR     bladder repair with mesh; CAN NEVER HAVE ABD SURG AGAIN     Social History   Socioeconomic History   Marital status: Single     Spouse name: Not on file   Number of children: Not on file   Years of education: Not on file   Highest education level: Not on file  Occupational History   Not on file  Tobacco Use   Smoking status: Every Day    Packs/day: 0    Types: Cigarettes   Smokeless tobacco: Never  Vaping Use   Vaping Use: Never used  Substance and Sexual Activity   Alcohol use: Yes   Drug use: Yes    Frequency: 7.0 times per week    Types: Marijuana   Sexual activity: Yes    Birth control/protection: I.U.D., Condom  Other Topics Concern   Not on file  Social History Narrative   Not on file   Social Determinants of Health   Financial Resource Strain: Not on file  Food Insecurity: Not on file  Transportation Needs: Not on file  Physical Activity: Not on file  Stress: Not on file  Social Connections: Not on file  Intimate Partner Violence: Not on file    Family History  Problem Relation Age of Onset   Hypertension Mother    Hyperlipidemia Mother    Hypotension Mother    Ovarian cancer Maternal Aunt        ? age   Breast cancer Neg Hx     Allergies  Allergen Reactions   Fire Ant Anaphylaxis   Penicillins Anaphylaxis and Rash  Has patient had a PCN reaction causing immediate rash, facial/tongue/throat swelling, SOB or lightheadedness with hypotension: Yes  Has patient had a PCN reaction causing severe rash involving mucus membranes or skin necrosis: Yes  Has patient had a PCN reaction that required hospitalization Yes- went to ed  Has patient had a PCN reaction occurring within the last 10 years: No- was a childhood allergy  If all of the above answers are "NO", then may proceed with Cephalosporin use.  Has patient had a PCN reaction causing immediate rash, facial/tongue/throat swelling, SOB or lightheadedness with hypotension: Yes  Has patient had a PCN reaction causing severe rash involving mucus membranes or skin necrosis: Yes  Has patient had a PCN reaction that required  hospitalization Yes- went to ed  Has patient had a PCN reaction occurring within the last 10 years: No- was a childhood allergy  If all of the above answers are "NO", then may proceed with Cephalosporin use.  Has patient had a PCN reaction causing immediate rash, facial/tongue/throat swelling, SOB or lightheadedness with hypotension: Yes  Has patient had a PCN reaction causing severe rash involving mucus membranes or skin necrosis: Yes  Has patient had a PCN reaction that required hospitalization Yes- went to ed  Has patient had a PCN reaction occurring within the last 10 years: No- was a childhood allergy  If all of the above answers are "NO", then may proceed with Cephalosporin use.  Has patient had a PCN reaction causing immediate rash, facial/tongue/throat swelling, SOB or lightheadedness with hypotension: Yes  Has patient had a PCN reaction causing severe rash involving mucus membranes or skin necrosis: Yes  Has patient had a PCN reaction that required hospitalization Yes- went to ed  Has patient had a PCN reaction occurring within the last 10 years: No- was a childhood allergy  If all of the above answers are "NO", then may proceed with Cephalosporin use.  Has patient had a PCN reaction causing immediate rash, facial/tongue/throat swelling, SOB or lightheadedness with hypotension: Yes, Has patient had a PCN reaction causing severe rash involving mucus membranes or skin necrosis: Yes, Has patient had a PCN reaction that required hospitalization Yes- went to ed, Has patient had a PCN reaction occurring within the last 10 years: No- was a childhood allergy, If all of the above answers are "NO", then may proceed with Cephalosporin use., Has patient had a PCN   Ciprofloxacin Other (See Comments)    Review of Systems  All other systems reviewed and are negative.      Objective:   BP 110/70   Pulse 81   Ht 5' 6.5" (1.689 m)   Wt 154 lb 3.2 oz (69.9 kg)   SpO2 98%   BMI 24.52 kg/m    Vitals:   04/12/23 1050  BP: 110/70  Pulse: 81  Height: 5' 6.5" (1.689 m)  Weight: 154 lb 3.2 oz (69.9 kg)  SpO2: 98%  BMI (Calculated): 24.52    Physical Exam Vitals and nursing note reviewed.  Constitutional:      General: She is awake.     Appearance: Normal appearance. She is well-developed and normal weight.  HENT:     Head: Normocephalic and atraumatic.  Eyes:     Pupils: Pupils are equal, round, and reactive to light.  Cardiovascular:     Rate and Rhythm: Normal rate.  Pulmonary:     Effort: Pulmonary effort is normal.  Neurological:     General: No focal deficit present.  Mental Status: She is alert and oriented to person, place, and time. Mental status is at baseline.  Psychiatric:        Mood and Affect: Mood normal.        Behavior: Behavior normal. Behavior is cooperative.        Thought Content: Thought content normal.        Judgment: Judgment normal.      No results found for any visits on 04/12/23.  No results found for this or any previous visit (from the past 2160 hour(s)).     Assessment & Plan:   Problem List Items Addressed This Visit       Active Problems   Seasonal allergic rhinitis due to pollen - Primary    Patient stable.  Well controlled with current therapy.   Continue current meds.       Relevant Orders   CBC With Differential   CMP14+EGFR   Contact with and (suspected) exposure to infections with a predominantly sexual mode of transmission    Checking labwork today.  Will contact patient with results.  Counseled pt on decreasing risk of sexual behavior.          Relevant Orders   CBC With Differential   CMP14+EGFR   HSV 1 and 2 Ab, IgG   RPR w/reflex to TrepSure   Acute Viral Hepatitis (HAV, HBV, HCV)   HIV Antibody (routine testing w rflx)   Vitamin D deficiency, unspecified    Checking labs today.  Will continue supplements as needed.       Relevant Orders   VITAMIN D 25 Hydroxy (Vit-D Deficiency,  Fractures)   CBC With Differential   CMP14+EGFR   Other Visit Diagnoses     B12 deficiency due to diet       Checking labs today.  Will continue supplements as needed.   Relevant Orders   CBC With Differential   CMP14+EGFR   Vitamin B12   Prediabetes       Checking labwork today.?Patient counseled on dietary choices and verbalized understanding.   Relevant Orders   CBC With Differential   CMP14+EGFR   Hemoglobin A1c   Hypothyroidism (acquired)       Relevant Orders   CBC With Differential   CMP14+EGFR   TSH   Mixed hyperlipidemia       Relevant Orders   Lipid panel   CBC With Differential   CMP14+EGFR   Essential hypertension, benign       Patient stable.  Well controlled. Will reassess at follow up.   Relevant Orders   CBC With Differential   CMP14+EGFR   Other fatigue       Relevant Orders   CBC With Differential   CMP14+EGFR       Return in about 2 months (around 06/12/2023) for F/U.   Total time spent: 30 minutes  Miki Kins, FNP  04/12/2023   This document may have been prepared by [facility] Voice Recognition software and as such may include unintentional dictation errors.

## 2023-04-24 NOTE — Assessment & Plan Note (Signed)
Checking labs today.  Will continue supplements as needed.  

## 2023-04-24 NOTE — Assessment & Plan Note (Signed)
Patient stable.  Well controlled with current therapy.   Continue current meds.  

## 2023-04-24 NOTE — Assessment & Plan Note (Signed)
Checking labwork today.  Will contact patient with results.  Counseled pt on decreasing risk of sexual behavior.

## 2023-04-24 NOTE — Telephone Encounter (Signed)
Discussed at follow up

## 2023-07-01 ENCOUNTER — Telehealth: Payer: Self-pay | Admitting: Family

## 2023-07-01 MED ORDER — CLINDAMYCIN HCL 300 MG PO CAPS: 300.0000 mg | ORAL_CAPSULE | Freq: Three times a day (TID) | ORAL | 0 refills | Status: AC

## 2023-07-01 NOTE — Telephone Encounter (Signed)
Patient left VM requesting an antibiotic for an abscess tooth. Please advise.

## 2023-07-13 ENCOUNTER — Ambulatory Visit: Payer: Medicaid Other | Admitting: Family

## 2023-07-20 ENCOUNTER — Ambulatory Visit: Payer: Medicaid Other | Admitting: Family

## 2023-07-20 ENCOUNTER — Encounter: Payer: Self-pay | Admitting: Family

## 2023-07-20 VITALS — BP 120/85 | Ht 66.0 in | Wt 152.2 lb

## 2023-07-20 DIAGNOSIS — Z975 Presence of (intrauterine) contraceptive device: Secondary | ICD-10-CM | POA: Diagnosis not present

## 2023-07-20 DIAGNOSIS — R87612 Low grade squamous intraepithelial lesion on cytologic smear of cervix (LGSIL): Secondary | ICD-10-CM

## 2023-07-20 DIAGNOSIS — N739 Female pelvic inflammatory disease, unspecified: Secondary | ICD-10-CM | POA: Diagnosis not present

## 2023-07-20 DIAGNOSIS — Z124 Encounter for screening for malignant neoplasm of cervix: Secondary | ICD-10-CM

## 2023-07-20 DIAGNOSIS — Z0130 Encounter for examination of blood pressure without abnormal findings: Secondary | ICD-10-CM

## 2023-07-20 DIAGNOSIS — Z0000 Encounter for general adult medical examination without abnormal findings: Secondary | ICD-10-CM

## 2023-07-20 DIAGNOSIS — Z01419 Encounter for gynecological examination (general) (routine) without abnormal findings: Secondary | ICD-10-CM

## 2023-07-20 DIAGNOSIS — R87810 Cervical high risk human papillomavirus (HPV) DNA test positive: Secondary | ICD-10-CM

## 2023-07-20 DIAGNOSIS — Z202 Contact with and (suspected) exposure to infections with a predominantly sexual mode of transmission: Secondary | ICD-10-CM | POA: Diagnosis not present

## 2023-07-20 DIAGNOSIS — N730 Acute parametritis and pelvic cellulitis: Secondary | ICD-10-CM

## 2023-07-20 MED ORDER — CYCLOBENZAPRINE HCL 10 MG PO TABS: 10.0000 mg | ORAL_TABLET | Freq: Three times a day (TID) | ORAL | 2 refills | Status: AC

## 2023-07-20 MED ORDER — DOXYCYCLINE HYCLATE 100 MG PO CAPS: 100.0000 mg | ORAL_CAPSULE | Freq: Two times a day (BID) | ORAL | 0 refills | Status: DC

## 2023-07-20 MED ORDER — FLUCONAZOLE 150 MG PO TABS: 150.0000 mg | ORAL_TABLET | Freq: Every day | ORAL | 0 refills | Status: DC

## 2023-07-20 MED ORDER — IBUPROFEN 400 MG PO TABS: 400.0000 mg | ORAL_TABLET | Freq: Four times a day (QID) | ORAL | 3 refills | Status: AC | PRN

## 2023-07-22 LAB — IGP,CTNGTV,APT HPV,RFX16/18,45
Chlamydia, Nuc. Acid Amp: NEGATIVE
Gonococcus, Nuc. Acid Amp: NEGATIVE
HPV Aptima: NEGATIVE
PAP Smear Comment: 0
Trich vag by NAA: NEGATIVE

## 2023-07-22 LAB — SPECIMEN STATUS REPORT

## 2023-08-15 ENCOUNTER — Encounter: Payer: Self-pay | Admitting: Family

## 2023-08-15 NOTE — Patient Instructions (Signed)

## 2023-08-15 NOTE — Progress Notes (Signed)
Complete physical exam  Patient: Lindsey Green   DOB: [birth year]   42 y.o. Female  MRN: [id number]  Subjective:    Chief Complaint  Patient presents with   Annual Exam    CPE    Lindsey Green is a 42 y.o. female who presents today for a complete physical exam. She reports consuming a general diet.  She generally feels fairly well. She reports sleeping fairly well. She does not have additional problems to discuss today.    Most recent fall risk assessment:     No data to display           Most recent depression screenings:     No data to display            Past Medical History:  Diagnosis Date   Anemia    LGSIL on Pap smear of cervix 2016   no colpo   Non-recurrent acute suppurative otitis media of right ear with spontaneous rupture of tympanic membrane 03/31/2021   Last Assessment & Plan: Formatting of this note might be different from the original. Recent right ear discomfort and drainage. Currently finishing up treatment for the above.  She is using topical drops. EXAM shows moist debris in the right ear that was cleaned out meticulously under the microscope revealing otherwise normal external canal with intact tympanic membrane. PLAN: Looks like she is he   Surgical wound dehiscence 05/27/2021   Last Assessment & Plan: Formatting of this note might be different from the original. Concern over postauricular wounds. 2 weeks out from excision of postauricular infections.  Subsequently has developed dehiscence of the wound on both sides.  She has been cleaning the wound with peroxide and applying topical antibiotic ointment.  She also has been on oral antibiotics.  The area is tender. EXAMINA    Past Surgical History:  Procedure Laterality Date   BLADDER REPAIR     CESAREAN SECTION     INCISIONAL HERNIA REPAIR     bladder repair with mesh; CAN NEVER HAVE ABD SURG AGAIN     Family History  Problem Relation Age of Onset   Hypertension Mother     Hyperlipidemia Mother    Hypotension Mother    Ovarian cancer Maternal Aunt        ? age   Breast cancer Neg Hx     Social History   Socioeconomic History   Marital status: Single    Spouse name: Not on file   Number of children: Not on file   Years of education: Not on file   Highest education level: Not on file  Occupational History   Not on file  Tobacco Use   Smoking status: Every Day    Types: Cigarettes   Smokeless tobacco: Never  Vaping Use   Vaping status: Never Used  Substance and Sexual Activity   Alcohol use: Yes   Drug use: Yes    Frequency: 7.0 times per week    Types: Marijuana   Sexual activity: Yes    Birth control/protection: I.U.D., Condom  Other Topics Concern   Not on file  Social History Narrative   Not on file   Social Determinants of Health   Financial Resource Strain: Not on file  Food Insecurity: Not on file  Transportation Needs: Not on file  Physical Activity: Not on file  Stress: Not on file  Social Connections: Not on file  Intimate Partner Violence: Not on file    Outpatient Medications Prior  to Visit  Medication Sig   acetaminophen (TYLENOL) 500 MG tablet Take 1,000 mg by mouth every 6 (six) hours as needed for fever.   albuterol (VENTOLIN HFA) 108 (90 Base) MCG/ACT inhaler Inhale 1 puff into the lungs every 4 (four) hours as needed.   EPINEPHrine 0.15 MG/0.15ML IJ injection Inject 0.15 mg into the muscle as needed.   fexofenadine-pseudoephedrine (ALLEGRA-D) 60-120 MG 12 hr tablet Take 1 tablet by mouth 2 (two) times daily.   HYDROcodone-acetaminophen (NORCO/VICODIN) 5-325 MG tablet Take 1 tablet by mouth every 4 (four) hours as needed for moderate pain.   hydrOXYzine (ATARAX) 10 MG tablet Take 1 tablet (10 mg total) by mouth 3 (three) times daily as needed.   IRON, FERROUS GLUCONATE, PO Take 1 tablet by mouth daily as needed (low iron).   levonorgestrel (MIRENA) 20 MCG/24HR IUD 1 each by Intrauterine route once.   Multiple  Vitamins-Minerals (MULTI ADULT GUMMIES PO) Take 2 capsules by mouth daily.   [DISCONTINUED] cyclobenzaprine (FLEXERIL) 10 MG tablet Take 10 mg by mouth 3 (three) times daily.   [DISCONTINUED] fluconazole (DIFLUCAN) 150 MG tablet Take 1 tablet (150 mg total) by mouth daily.   [DISCONTINUED] ibuprofen (ADVIL) 400 MG tablet Take 1 tablet (400 mg total) by mouth every 6 (six) hours as needed for fever or mild pain.   [DISCONTINUED] nitrofurantoin, macrocrystal-monohydrate, (MACROBID) 100 MG capsule Take 1 capsule (100 mg total) by mouth 2 (two) times daily.   [DISCONTINUED] predniSONE (STERAPRED UNI-PAK 21 TAB) 10 MG (21) TBPK tablet Take according to package instructions.   No facility-administered medications prior to visit.    Review of Systems  All other systems reviewed and are negative.       Objective:     BP 120/85   Ht 5\' 6"  (1.676 m)   Wt 152 lb 3.2 oz (69 kg)   BMI 24.57 kg/m    Physical Exam Vitals and nursing note reviewed. Exam conducted with a chaperone present.  Constitutional:      Appearance: Normal appearance. She is normal weight.  HENT:     Head: Normocephalic.  Eyes:     Extraocular Movements: Extraocular movements intact.     Conjunctiva/sclera: Conjunctivae normal.     Pupils: Pupils are equal, round, and reactive to light.  Cardiovascular:     Rate and Rhythm: Normal rate.  Pulmonary:     Effort: Pulmonary effort is normal.  Genitourinary:    General: Normal vulva.     Exam position: Lithotomy position.     Vagina: Normal.     Cervix: Normal.     Uterus: Normal.   Musculoskeletal:        General: Normal range of motion.  Neurological:     General: No focal deficit present.     Mental Status: She is alert and oriented to person, place, and time. Mental status is at baseline.  Psychiatric:        Mood and Affect: Mood normal.        Behavior: Behavior normal.        Thought Content: Thought content normal.        Judgment: Judgment normal.       Results for orders placed or performed in visit on 07/20/23  Specimen status report  Result Value Ref Range   specimen status report Comment   IGP,CtNgTv,Apt HPV,rfx16/18,45  Result Value Ref Range   DIAGNOSIS: Comment    Specimen adequacy: Comment    Clinician Provided ICD10 Comment  Performed by: Comment    PAP Smear Comment .    Note: Comment    Test Methodology Comment    HPV Aptima Negative Negative   HPV Genotype Reflex Comment    Chlamydia, Nuc. Acid Amp Negative Negative   Gonococcus, Nuc. Acid Amp Negative Negative   Trich vag by NAA Negative Negative    Recent Results (from the past 2160 hour(s))  IGP,CtNgTv,Apt HPV,rfx16/18,45     Status: None   Collection Time: 07/20/23 12:00 AM  Result Value Ref Range   DIAGNOSIS: Comment     Comment: NEGATIVE FOR INTRAEPITHELIAL LESION OR MALIGNANCY.   Specimen adequacy: Comment     Comment: Satisfactory for evaluation. No endocervical component is identified.   Clinician Provided ICD10 Comment     Comment: Z97.5 Z20.2 N73.9 N73.0 R87.612 R87.810    Performed by: Comment     Comment: Park Liter, Cytotechnologist (ASCP)   PAP Smear Comment .    Note: Comment     Comment: The Pap smear is a screening test designed to aid in the detection of premalignant and malignant conditions of the uterine cervix.  It is not a diagnostic procedure and should not be used as the sole means of detecting cervical cancer.  Both false-positive and false-negative reports do occur.    Test Methodology Comment     Comment: This liquid based ThinPrep(R) pap test was screened with the use of an image guided system.    HPV Aptima Negative Negative    Comment: This nucleic acid amplification test detects fourteen high-risk HPV types (16,18,31,33,35,39,45,51,52,56,58,59,66,68) without differentiation.    HPV Genotype Reflex Comment     Comment: Criteria not met, HPV Genotype not performed.   Chlamydia, Nuc. Acid Amp Negative  Negative   Gonococcus, Nuc. Acid Amp Negative Negative   Trich vag by NAA Negative Negative  Specimen status report     Status: None   Collection Time: 07/20/23 12:00 AM  Result Value Ref Range   specimen status report Comment     Comment: Please note Please note The date and/or time of collection was not indicated on the requisition as required by state and federal law.  The date of receipt of the specimen was used as the collection date if not supplied.         Assessment & Plan:    Routine Health Maintenance and Physical Exam   There is no immunization history on file for this patient.  Health Maintenance  Topic Date Due   Hepatitis C Screening  Never done   DTaP/Tdap/Td (1 - Tdap) Never done   INFLUENZA VACCINE  Never done   COVID-19 Vaccine (1 - 2023-24 season) Never done   Cervical Cancer Screening (HPV/Pap Cotest)  07/19/2028   HIV Screening  Completed   HPV VACCINES  Aged Out    Discussed health benefits of physical activity, and encouraged her to engage in regular exercise appropriate for her age and condition.  Problem List Items Addressed This Visit       Active Problems   PID (acute pelvic inflammatory disease)   Relevant Medications   fluconazole (DIFLUCAN) 150 MG tablet   Other Relevant Orders   IGP,CtNgTv,Apt HPV,rfx16/18,45 (Completed)   IUD (intrauterine device) in place - Primary   Relevant Orders   IGP,CtNgTv,Apt HPV,rfx16/18,45 (Completed)   LGSIL on Pap smear of cervix   Relevant Orders   IGP,CtNgTv,Apt HPV,rfx16/18,45 (Completed)   Cervical high risk human papillomavirus (HPV) DNA test positive   Relevant Orders  IGP,CtNgTv,Apt HPV,rfx16/18,45 (Completed)   Contact with and (suspected) exposure to infections with a predominantly sexual mode of transmission   Relevant Orders   IGP,CtNgTv,Apt HPV,rfx16/18,45 (Completed)   Other Visit Diagnoses     PID (pelvic inflammatory disease)       Relevant Medications   fluconazole (DIFLUCAN)  150 MG tablet   Other Relevant Orders   IGP,CtNgTv,Apt HPV,rfx16/18,45 (Completed)      No follow-ups on file.     Miki Kins, FNP  07/20/2023   This document may have been prepared by [facility] Voice Recognition software and as such may include unintentional dictation errors.

## 2024-02-01 ENCOUNTER — Ambulatory Visit: Admitting: Family

## 2024-04-08 ENCOUNTER — Other Ambulatory Visit: Payer: Self-pay

## 2024-04-08 ENCOUNTER — Emergency Department

## 2024-04-08 ENCOUNTER — Emergency Department
Admission: EM | Admit: 2024-04-08 | Discharge: 2024-04-08 | Disposition: A | Discharge status: Home / Self Care | Attending: Emergency Medicine | Admitting: Emergency Medicine

## 2024-04-08 DIAGNOSIS — A549 Gonococcal infection, unspecified: Secondary | ICD-10-CM | POA: Insufficient documentation

## 2024-04-08 DIAGNOSIS — R1030 Lower abdominal pain, unspecified: Secondary | ICD-10-CM

## 2024-04-08 DIAGNOSIS — N83202 Unspecified ovarian cyst, left side: Secondary | ICD-10-CM | POA: Insufficient documentation

## 2024-04-08 DIAGNOSIS — R300 Dysuria: Secondary | ICD-10-CM | POA: Diagnosis present

## 2024-04-08 DIAGNOSIS — N390 Urinary tract infection, site not specified: Secondary | ICD-10-CM | POA: Insufficient documentation

## 2024-04-08 HISTORY — DX: Disorder of kidney and ureter, unspecified: N28.9

## 2024-04-08 HISTORY — DX: Urinary tract infection, site not specified: N39.0

## 2024-04-08 LAB — CBC
HCT: 38.5 % (ref 36.0–46.0)
Hemoglobin: 12.9 g/dL (ref 12.0–15.0)
MCH: 31.2 pg (ref 26.0–34.0)
MCHC: 33.5 g/dL (ref 30.0–36.0)
MCV: 93 fL (ref 80.0–100.0)
Platelets: 314 10*3/uL (ref 150–400)
RBC: 4.14 MIL/uL (ref 3.87–5.11)
RDW: 12.6 % (ref 11.5–15.5)
WBC: 14.4 10*3/uL — ABNORMAL HIGH (ref 4.0–10.5)
nRBC: 0 % (ref 0.0–0.2)

## 2024-04-08 LAB — URINALYSIS, W/ REFLEX TO CULTURE (INFECTION SUSPECTED)
Bilirubin Urine: NEGATIVE
Glucose, UA: NEGATIVE mg/dL
Ketones, ur: NEGATIVE mg/dL
Nitrite: POSITIVE — AB
Protein, ur: NEGATIVE mg/dL
Specific Gravity, Urine: 1.015 (ref 1.005–1.030)
WBC, UA: >50 WBC/hpf (ref 0–5)
pH: 8 (ref 5.0–8.0)

## 2024-04-08 LAB — HEPATIC FUNCTION PANEL
ALT: 13 U/L (ref 0–44)
AST: 19 U/L (ref 15–41)
Albumin: 3.5 g/dL (ref 3.5–5.0)
Alkaline Phosphatase: 56 U/L (ref 38–126)
Bilirubin, Direct: 0.1 mg/dL (ref 0.0–0.2)
Indirect Bilirubin: 0.8 mg/dL (ref 0.3–0.9)
Total Bilirubin: 0.9 mg/dL (ref 0.0–1.2)
Total Protein: 6.8 g/dL (ref 6.5–8.1)

## 2024-04-08 LAB — BASIC METABOLIC PANEL WITH GFR
Anion gap: 8 (ref 5–15)
BUN: 8 mg/dL (ref 6–20)
CO2: 25 mmol/L (ref 22–32)
Calcium: 8.6 mg/dL — ABNORMAL LOW (ref 8.9–10.3)
Chloride: 101 mmol/L (ref 98–111)
Creatinine, Ser: 0.72 mg/dL (ref 0.44–1.00)
GFR, Estimated: >60 mL/min (ref >60)
Glucose, Bld: 123 mg/dL — ABNORMAL HIGH (ref 70–99)
Potassium: 3.8 mmol/L (ref 3.5–5.1)
Sodium: 134 mmol/L — ABNORMAL LOW (ref 135–145)

## 2024-04-08 LAB — CHLAMYDIA/NGC RT PCR (ARMC ONLY)
Chlamydia Tr: NOT DETECTED
N gonorrhoeae: DETECTED — AB

## 2024-04-08 LAB — PREGNANCY, URINE: Preg Test, Ur: NEGATIVE

## 2024-04-08 LAB — POC URINE PREG, ED: Preg Test, Ur: NEGATIVE

## 2024-04-08 MED ORDER — CEFDINIR 300 MG PO CAPS
300.0000 mg | ORAL_CAPSULE | Freq: Two times a day (BID) | ORAL | 0 refills | Status: AC
Start: 2024-04-08 — End: 2024-04-18

## 2024-04-08 MED ORDER — SODIUM CHLORIDE 0.9 % IV SOLN
1.0000 g | Freq: Once | INTRAVENOUS | Status: AC
  Administered 2024-04-08: 1 g via INTRAVENOUS
  Filled 2024-04-08: qty 10

## 2024-04-08 MED ORDER — MORPHINE SULFATE (PF) 4 MG/ML IV SOLN
4.0000 mg | Freq: Once | INTRAVENOUS | Status: AC
  Administered 2024-04-08: 4 mg via INTRAVENOUS
  Filled 2024-04-08: qty 1

## 2024-04-08 MED ORDER — CEFDINIR 300 MG PO CAPS: 300.0000 mg | ORAL_CAPSULE | Freq: Two times a day (BID) | ORAL | 0 refills | Status: DC

## 2024-04-08 MED ORDER — IOHEXOL 300 MG/ML  SOLN
100.0000 mL | Freq: Once | INTRAMUSCULAR | Status: AC | PRN
  Administered 2024-04-08: 100 mL via INTRAVENOUS

## 2024-04-08 MED ORDER — ONDANSETRON HCL 4 MG/2ML IJ SOLN
4.0000 mg | Freq: Once | INTRAMUSCULAR | Status: AC
  Administered 2024-04-08: 4 mg via INTRAVENOUS
  Filled 2024-04-08: qty 2

## 2024-04-08 NOTE — ED Triage Notes (Signed)
 Pt to ED for possible UTI, hx recurrent UTI and kidney infx. Pt has hx bladder surgery--herniated bladder--bladder tacked to c/s incision 11 years ago. Having lower back pain and bladder pain. Pt appears flushed. Also endorses L flank pain. Everything started several days ago. Painful to urinate.

## 2024-04-08 NOTE — ED Provider Notes (Signed)
 [facility] Provider Note    Event Date/Time   First MD Initiated Contact with Patient 04/08/24 1226     (approximate)   History   Recurrent UTI and Back Pain   HPI  Lindsey Green is a 43 y.o. female with history of recurrent UTI, chronic hip pain, prior history of PID, presenting with suprapubic and left-sided abdominal pain as well as dysuria.  Patient states that she had some vaginal bleeding yesterday, none today, states that she noticed some blood just with her urine today.  Also with dysuria.  She denies any vaginal discharge.  States that symptoms feel different from prior PID as well as UTIs.  No nausea vomiting or diarrhea, no fever.  Did also say that she has history of bladder surgeries in the past and that her bladder was tacked to her abdominal wall.   On independent review, she was seen at her primary care's office in September of last year, was complaining about milky white vaginal discharge that time that was yellow at first, at that time also noted vaginal irritation and pain with sex as well as lower back pain and increased urinary frequency.  Patient states that she does not have those symptoms at this time.  Physical Exam   Triage Vital Signs: ED Triage Vitals  Encounter Vitals Group     BP 04/08/24 1123 109/63     Girls Systolic BP Percentile --      Girls Diastolic BP Percentile --      Boys Systolic BP Percentile --      Boys Diastolic BP Percentile --      Pulse Rate 04/08/24 1123 97     Resp 04/08/24 1123 20     Temp 04/08/24 1123 97.8 F (36.6 C)     Temp Source 04/08/24 1123 Oral     SpO2 04/08/24 1123 100 %     Weight 04/08/24 1124 165 lb (74.8 kg)     Height 04/08/24 1124 5' 6 (1.676 m)     Head Circumference --      Peak Flow --      Pain Score 04/08/24 1120 10     Pain Loc --      Pain Education --      Exclude from Growth Chart --     Most recent vital signs: Vitals:   04/08/24 1543 04/08/24 1630  BP:   102/63  Pulse:  86  Resp:  16  Temp: 97.9 F (36.6 C)   SpO2:  98%     General: Awake, no distress.  CV:  Good peripheral perfusion.  Resp:  Normal effort.  Abd:  No distention.  Soft nontender Other:  No flank or CVA tenderness, no obvious rash, external GU exam, no obvious rash, bimanual exam was done, no CMT, no adnexal tenderness, exam was done with a chaperone   ED Results / Procedures / Treatments   Labs (all labs ordered are listed, but only abnormal results are displayed) Labs Reviewed  CHLAMYDIA/NGC RT PCR ([facility] ONLY)           - Abnormal; Notable for the following components:      Result Value   N gonorrhoeae DETECTED (*)    All other components within normal limits  BASIC METABOLIC PANEL WITH GFR - Abnormal; Notable for the following components:   Sodium 134 (*)    Glucose, Bld 123 (*)    Calcium 8.6 (*)    All other  components within normal limits  CBC - Abnormal; Notable for the following components:   WBC 14.4 (*)    All other components within normal limits  URINALYSIS, W/ REFLEX TO CULTURE (INFECTION SUSPECTED) - Abnormal; Notable for the following components:   Color, Urine YELLOW (*)    APPearance CLOUDY (*)    Hgb urine dipstick SMALL (*)    Nitrite POSITIVE (*)    Leukocytes,Ua SMALL (*)    Bacteria, UA MANY (*)    All other components within normal limits  HEPATIC FUNCTION PANEL  PREGNANCY, URINE  POC URINE PREG, ED       RADIOLOGY On my independent interpretation, CT without obvious free air   PROCEDURES:  Critical Care performed: No  Procedures   MEDICATIONS ORDERED IN ED: Medications  morphine  (PF) 4 MG/ML injection 4 mg (4 mg Intravenous Given 04/08/24 1255)  ondansetron  (ZOFRAN ) injection 4 mg (4 mg Intravenous Given 04/08/24 1258)  cefTRIAXone (ROCEPHIN) 1 g in sodium chloride 0.9 % 100 mL IVPB (0 g Intravenous Stopped 04/08/24 1443)  iohexol (OMNIPAQUE) 300 MG/ML solution 100 mL (100 mLs Intravenous Contrast Given 04/08/24 1421)      IMPRESSION / MDM / ASSESSMENT AND PLAN / ED COURSE  I reviewed the triage vital signs and the nursing notes.                              Differential diagnosis includes, but is not limited to, UTI, pyelonephritis, colitis, diverticulitis, nephrolithiasis, musculoskeletal pain, did considered cervicitis, PID but patient denies any vaginal discharge or irritation at this time.  Will get labs, UA, CT abdomen pelvis.  Patient's presentation is most consistent with acute presentation with potential threat to life or bodily function.  Independent interpretation of labs and imaging below.  UA is consistent with UTI, patient has been given ceftriaxone in the past, will give her a dose of IV ceftriaxone here.  Patient was also to have gonorrhea on labs, already got ceftriaxone, chlamydia was negative.  Patient states that she is sexually active but uses condoms.  No CMT or adnexal tenderness on exam.  Will get a pelvic ultrasound to further assess the ovarian cyst.  Ultrasound done, radiologist read it as left ovarian cyst likely hemorrhagic, on independent review, she has had prior ultrasounds that noted left ovarian cyst.  Doubt TOA or PID at this time.  Considered but no indication for inpatient admission at this time, she is safe for outpatient management.  Will discharge with outpatient antibiotics, instructed to follow-up with primary care doctor for further management of her symptoms.  Instructed her to let her sexual partners know to get tested and treated for the gonorrhea.  Discharge with strict return precautions.  The patient is on the cardiac monitor to evaluate for evidence of arrhythmia and/or significant heart rate changes.   Clinical Course as of 04/08/24 1734  Sat Apr 08, 2024  1338 Independent review of labs, she has a leukocytosis, electrolytes on is not severely deranged, LFTs are normal, pregnancy test is negative, UA is consistent with UTI. [TT]  1454 CT ABDOMEN PELVIS W  CONTRAST IMPRESSION: 1. There is a 4.3 cm LEFT ovarian cystic appearing mass with internal layering hyperdensity. This is favored to reflect a hemorrhagic cyst versus endometrioma. Follow-up pelvic ultrasound in 6 weeks would be recommended to assess for resolution. 2. Common bile duct is prominent and measures at least 8 mm. Recommend correlation with LFTs.   [  TT]  1455 Chlamydia/NGC rt PCR ([facility] only)(!) [TT]  1455 N gonorrhoeae(!): DETECTED [TT]  1725 US  PELVIC TRANSABD W/PELVIC DOPPLER IMPRESSION: 1. No evidence of ovarian torsion. 2. Complex cyst in the left ovary, likely hemorrhagic cyst. Follow-up ultrasound is suggested in 6-12 weeks to document resolution.   [TT]    Clinical Course User Index [TT] Shane Darling, MD     FINAL CLINICAL IMPRESSION(S) / ED DIAGNOSES   Final diagnoses:  Lower abdominal pain  Urinary tract infection without hematuria, site unspecified  Left ovarian cyst  Gonorrhea     Rx / DC Orders   ED Discharge Orders          Ordered    cefdinir (OMNICEF) 300 MG capsule  2 times daily,   Status:  Discontinued        04/08/24 1726    cefdinir (OMNICEF) 300 MG capsule  2 times daily        04/08/24 1733             Note:  This document was prepared using Dragon voice recognition software and may include unintentional dictation errors.    Shane Darling, MD 04/08/24 [phone]

## 2024-04-08 NOTE — Discharge Instructions (Addendum)
 Please take the antibiotics for UTI.  Please make sure to get your sexual partners tested and treated for gonorrhea.

## 2024-05-10 ENCOUNTER — Encounter: Payer: Self-pay | Admitting: Family

## 2024-05-10 ENCOUNTER — Ambulatory Visit: Payer: Self-pay | Admitting: Family

## 2024-05-10 VITALS — BP 119/74 | HR 86 | Ht 66.0 in | Wt 154.4 lb

## 2024-05-10 DIAGNOSIS — Z202 Contact with and (suspected) exposure to infections with a predominantly sexual mode of transmission: Secondary | ICD-10-CM

## 2024-05-10 DIAGNOSIS — N760 Acute vaginitis: Secondary | ICD-10-CM

## 2024-05-10 DIAGNOSIS — Z0130 Encounter for examination of blood pressure without abnormal findings: Secondary | ICD-10-CM

## 2024-05-10 DIAGNOSIS — R300 Dysuria: Secondary | ICD-10-CM

## 2024-05-10 LAB — POCT URINALYSIS DIPSTICK
Bilirubin, UA: NEGATIVE
Blood, UA: POSITIVE
Glucose, UA: NEGATIVE
Ketones, UA: NEGATIVE
Nitrite, UA: NEGATIVE
Protein, UA: NEGATIVE
Spec Grav, UA: 1.025 (ref 1.010–1.025)
Urobilinogen, UA: 0.2 U/dL
pH, UA: 6 (ref 5.0–8.0)

## 2024-05-10 MED ORDER — NITROFURANTOIN MONOHYD MACRO 100 MG PO CAPS
100.0000 mg | ORAL_CAPSULE | Freq: Two times a day (BID) | ORAL | 0 refills | Status: DC
Start: 2024-05-10 — End: 2024-05-15

## 2024-05-10 MED ORDER — CEFIXIME 400 MG PO CAPS: 800.0000 mg | ORAL_CAPSULE | Freq: Once | ORAL | 0 refills | Status: AC

## 2024-05-10 NOTE — Progress Notes (Signed)
 Established Patient Office Visit  Subjective:  Patient ID: Lindsey Green, female    DOB: Dec 31, 1980  Age: 43 y.o. MRN: [id number]  Chief Complaint  Patient presents with   Follow-up    Patient is here today for follow up.  She had a UTI and positive GC at the Emergency room recently, so she asks if we can recheck.   Doing well otherwise, does not have any other concerns.     Past Medical History:  Diagnosis Date   Anemia    LGSIL on Pap smear of cervix 2016   no colpo   Non-recurrent acute suppurative otitis media of right ear with spontaneous rupture of tympanic membrane 03/31/2021   Last Assessment & Plan: Formatting of this note might be different from the original. Recent right ear discomfort and drainage. Currently finishing up treatment for the above.  She is using topical drops. EXAM shows moist debris in the right ear that was cleaned out meticulously under the microscope revealing otherwise normal external canal with intact tympanic membrane. PLAN: Looks like she is he   Renal disorder    several kidney infections and UTIs   Surgical wound dehiscence 05/27/2021   Last Assessment & Plan: Formatting of this note might be different from the original. Concern over postauricular wounds. 2 weeks out from excision of postauricular infections.  Subsequently has developed dehiscence of the wound on both sides.  She has been cleaning the wound with peroxide and applying topical antibiotic ointment.  She also has been on oral antibiotics.  The area is tender. EXAMINA   UTI (urinary tract infection)     Past Surgical History:  Procedure Laterality Date   BLADDER REPAIR     CESAREAN SECTION     4 cesareans   INCISIONAL HERNIA REPAIR     bladder repair with mesh; CAN NEVER HAVE ABD SURG AGAIN     Social History   Socioeconomic History   Marital status: Single    Spouse name: Not on file   Number of children: Not on file   Years of education: Not on file   Highest  education level: Not on file  Occupational History   Not on file  Tobacco Use   Smoking status: Every Day    Types: Cigarettes   Smokeless tobacco: Never  Vaping Use   Vaping status: Never Used  Substance and Sexual Activity   Alcohol use: Yes    Comment: occ   Drug use: Yes    Frequency: 7.0 times per week    Types: Marijuana   Sexual activity: Yes    Birth control/protection: I.U.D., Condom  Other Topics Concern   Not on file  Social History Narrative   Not on file   Social Drivers of Health   Financial Resource Strain: Not on file  Food Insecurity: Not on file  Transportation Needs: Not on file  Physical Activity: Not on file  Stress: Not on file  Social Connections: Not on file  Intimate Partner Violence: Not on file    Family History  Problem Relation Age of Onset   Hypertension Mother    Hyperlipidemia Mother    Hypotension Mother    Ovarian cancer Maternal Aunt        ? age   Breast cancer Neg Hx     Allergies  Allergen Reactions   Fire Ant Anaphylaxis   Penicillins Anaphylaxis and Rash    Has patient had a PCN reaction causing immediate rash, facial/tongue/throat swelling,  SOB or lightheadedness with hypotension: Yes  Has patient had a PCN reaction causing severe rash involving mucus membranes or skin necrosis: Yes  Has patient had a PCN reaction that required hospitalization Yes- went to ed  Has patient had a PCN reaction occurring within the last 10 years: No- was a childhood allergy  If all of the above answers are NO, then may proceed with Cephalosporin use.  Has patient had a PCN reaction causing immediate rash, facial/tongue/throat swelling, SOB or lightheadedness with hypotension: Yes  Has patient had a PCN reaction causing severe rash involving mucus membranes or skin necrosis: Yes  Has patient had a PCN reaction that required hospitalization Yes- went to ed  Has patient had a PCN reaction occurring within the last 10 years: No- was a  childhood allergy  If all of the above answers are NO, then may proceed with Cephalosporin use.  Has patient had a PCN reaction causing immediate rash, facial/tongue/throat swelling, SOB or lightheadedness with hypotension: Yes  Has patient had a PCN reaction causing severe rash involving mucus membranes or skin necrosis: Yes  Has patient had a PCN reaction that required hospitalization Yes- went to ed  Has patient had a PCN reaction occurring within the last 10 years: No- was a childhood allergy  If all of the above answers are NO, then may proceed with Cephalosporin use.  Has patient had a PCN reaction causing immediate rash, facial/tongue/throat swelling, SOB or lightheadedness with hypotension: Yes  Has patient had a PCN reaction causing severe rash involving mucus membranes or skin necrosis: Yes  Has patient had a PCN reaction that required hospitalization Yes- went to ed  Has patient had a PCN reaction occurring within the last 10 years: No- was a childhood allergy  If all of the above answers are NO, then may proceed with Cephalosporin use.  Has patient had a PCN reaction causing immediate rash, facial/tongue/throat swelling, SOB or lightheadedness with hypotension: Yes, Has patient had a PCN reaction causing severe rash involving mucus membranes or skin necrosis: Yes, Has patient had a PCN reaction that required hospitalization Yes- went to ed, Has patient had a PCN reaction occurring within the last 10 years: No- was a childhood allergy, If all of the above answers are NO, then may proceed with Cephalosporin use., Has patient had a PCN   Ciprofloxacin Other (See Comments)    Review of Systems  Genitourinary:  Positive for dysuria, frequency and urgency.  All other systems reviewed and are negative.      Objective:   BP 119/74   Pulse 86   Ht 5' 6 (1.676 m)   Wt 154 lb 6.4 oz (70 kg)   SpO2 97%   BMI 24.92 kg/m   Vitals:   05/10/24 1302  BP: 119/74  Pulse: 86   Height: 5' 6 (1.676 m)  Weight: 154 lb 6.4 oz (70 kg)  SpO2: 97%  BMI (Calculated): 24.93    Physical Exam Vitals and nursing note reviewed.  Constitutional:      Appearance: Normal appearance. She is normal weight.  HENT:     Head: Normocephalic.  Eyes:     Extraocular Movements: Extraocular movements intact.     Conjunctiva/sclera: Conjunctivae normal.     Pupils: Pupils are equal, round, and reactive to light.  Cardiovascular:     Rate and Rhythm: Normal rate.  Pulmonary:     Effort: Pulmonary effort is normal.  Neurological:     General: No focal deficit present.  Mental Status: She is alert and oriented to person, place, and time. Mental status is at baseline.  Psychiatric:        Mood and Affect: Mood normal.        Behavior: Behavior normal.        Thought Content: Thought content normal.      No results found for any visits on 05/10/24.  Recent Results (from the past 2160 hours)  Basic metabolic panel     Status: Abnormal   Collection Time: 04/08/24 11:25 AM  Result Value Ref Range   Sodium 134 (L) 135 - 145 mmol/L   Potassium 3.8 3.5 - 5.1 mmol/L   Chloride 101 98 - 111 mmol/L   CO2 25 22 - 32 mmol/L   Glucose, Bld 123 (H) 70 - 99 mg/dL    Comment: Glucose reference range applies only to samples taken after fasting for at least 8 hours.   BUN 8 6 - 20 mg/dL   Creatinine, Ser 9.27 0.44 - 1.00 mg/dL   Calcium 8.6 (L) 8.9 - 10.3 mg/dL   GFR, Estimated >39 >39 mL/min    Comment: (NOTE) Calculated using the CKD-EPI Creatinine Equation (2021)    Anion gap 8 5 - 15    Comment: Performed at [facility], [address] [place], [place] [address]  CBC     Status: Abnormal   Collection Time: 04/08/24 11:25 AM  Result Value Ref Range   WBC 14.4 (H) 4.0 - 10.5 K/uL   RBC 4.14 3.87 - 5.11 MIL/uL   Hemoglobin 12.9 12.0 - 15.0 g/dL   HCT 61.4 63.9 - 53.9 %   MCV 93.0 80.0 - 100.0 fL   MCH 31.2 26.0 - 34.0 pg   MCHC 33.5 30.0 - 36.0 g/dL   RDW  87.3 88.4 - 84.4 %   Platelets 314 150 - 400 K/uL   nRBC 0.0 0.0 - 0.2 %    Comment: Performed at [facility], [address] [place], [place] [address]  Hepatic function panel     Status: None   Collection Time: 04/08/24 11:25 AM  Result Value Ref Range   Total Protein 6.8 6.5 - 8.1 g/dL   Albumin 3.5 3.5 - 5.0 g/dL   AST 19 15 - 41 U/L   ALT 13 0 - 44 U/L   Alkaline Phosphatase 56 38 - 126 U/L   Total Bilirubin 0.9 0.0 - 1.2 mg/dL   Bilirubin, Direct 0.1 0.0 - 0.2 mg/dL   Indirect Bilirubin 0.8 0.3 - 0.9 mg/dL    Comment: Performed at [facility], [address] [place], [place] [address]  Urinalysis, w/ Reflex to Culture (Infection Suspected) -Urine, Clean Catch     Status: Abnormal   Collection Time: 04/08/24  1:00 PM  Result Value Ref Range   Specimen Source URINE, CLEAN CATCH    Color, Urine YELLOW (A) YELLOW   APPearance CLOUDY (A) CLEAR   Specific Gravity, Urine 1.015 1.005 - 1.030   pH 8.0 5.0 - 8.0   Glucose, UA NEGATIVE NEGATIVE mg/dL   Hgb urine dipstick SMALL (A) NEGATIVE   Bilirubin Urine NEGATIVE NEGATIVE   Ketones, ur NEGATIVE NEGATIVE mg/dL   Protein, ur NEGATIVE NEGATIVE mg/dL   Nitrite POSITIVE (A) NEGATIVE   Leukocytes,Ua SMALL (A) NEGATIVE   RBC / HPF 0-5 0 - 5 RBC/hpf   WBC, UA >50 0 - 5 WBC/hpf    Comment:        Reflex urine culture  not performed if WBC <=10, OR if Squamous epithelial cells >5. If Squamous epithelial cells >5 suggest recollection.    Bacteria, UA MANY (A) NONE SEEN   Squamous Epithelial / HPF 11-20 0 - 5 /HPF   Mucus PRESENT     Comment: Performed at [facility], [address] [place], [place] [address]  Pregnancy, urine     Status: None   Collection Time: 04/08/24  1:00 PM  Result Value Ref Range   Preg Test, Ur NEGATIVE NEGATIVE    Comment:        THE SENSITIVITY OF THIS METHODOLOGY IS >25 mIU/mL. Performed at [facility], [address] [place], [place] [address]    Chlamydia/NGC rt PCR [facility] only)     Status: Abnormal   Collection Time: 04/08/24  1:00 PM   Specimen: Urine, Clean Catch  Result Value Ref Range   Specimen source GC/Chlam ENDOCERVICAL    Chlamydia Tr NOT DETECTED NOT DETECTED   N gonorrhoeae DETECTED (A) NOT DETECTED    Comment: (NOTE) This CT/NG assay has not been evaluated in patients with a history of  hysterectomy. Performed at [facility], [address] [place], [place] [address]   POC urine preg, ED     Status: None   Collection Time: 04/08/24  1:07 PM  Result Value Ref Range   Preg Test, Ur Negative Negative       Assessment & Plan Contact with and (suspected) exposure to infections with a predominantly sexual mode of transmission Acute vaginitis Nuswab sent to lab today.  Will call with results when available.   Dysuria UA in office today abnormal.  Will send antibiotics, and will send to lab for further evaluation     Return to be determined based on results.   Total time spent: 20 minutes  ALAN CHRISTELLA ARRANT, FNP  05/10/2024   This document may have been prepared by [facility] Voice Recognition software and as such may include unintentional dictation errors.

## 2024-05-10 NOTE — Assessment & Plan Note (Signed)
 Nuswab sent to lab today.  Will call with results when available.

## 2024-05-11 LAB — URINALYSIS, ROUTINE W REFLEX MICROSCOPIC
Bilirubin, UA: NEGATIVE
Glucose, UA: NEGATIVE
Ketones, UA: NEGATIVE
Nitrite, UA: NEGATIVE
RBC, UA: NEGATIVE
Specific Gravity, UA: 1.025 (ref 1.005–1.030)
Urobilinogen, Ur: 0.2 mg/dL (ref 0.2–1.0)
pH, UA: 6 (ref 5.0–7.5)

## 2024-05-11 LAB — MICROSCOPIC EXAMINATION
Casts: NONE SEEN /LPF
RBC, Urine: NONE SEEN /HPF (ref 0–2)
WBC, UA: >30 /HPF — AB (ref 0–5)

## 2024-05-13 LAB — NUSWAB VG+, HSV
Atopobium vaginae: HIGH {score} — AB
BVAB 2: HIGH {score} — AB
Candida albicans, NAA: NEGATIVE
Candida glabrata, NAA: NEGATIVE
Chlamydia trachomatis, NAA: NEGATIVE
HSV 1 NAA: NEGATIVE
HSV 2 NAA: NEGATIVE
Neisseria gonorrhoeae, NAA: NEGATIVE
Trich vag by NAA: NEGATIVE

## 2024-05-13 LAB — URINE CULTURE

## 2024-05-15 ENCOUNTER — Other Ambulatory Visit: Payer: Self-pay

## 2024-05-15 MED ORDER — METRONIDAZOLE 500 MG PO TABS: 500.0000 mg | ORAL_TABLET | Freq: Two times a day (BID) | ORAL | 0 refills | Status: DC

## 2024-05-15 MED ORDER — FLUCONAZOLE 100 MG PO TABS: ORAL_TABLET | ORAL | 0 refills | Status: DC

## 2024-05-15 MED ORDER — NITROFURANTOIN MONOHYD MACRO 100 MG PO CAPS: 100.0000 mg | ORAL_CAPSULE | Freq: Two times a day (BID) | ORAL | 0 refills | Status: DC

## 2024-07-07 ENCOUNTER — Ambulatory Visit: Admitting: Family

## 2024-07-07 DIAGNOSIS — N760 Acute vaginitis: Secondary | ICD-10-CM

## 2024-07-07 DIAGNOSIS — R300 Dysuria: Secondary | ICD-10-CM | POA: Diagnosis not present

## 2024-07-07 LAB — POCT URINALYSIS DIPSTICK
Bilirubin, UA: NEGATIVE
Blood, UA: NEGATIVE
Glucose, UA: NEGATIVE
Ketones, UA: NEGATIVE
Nitrite, UA: NEGATIVE
Protein, UA: POSITIVE — AB
Spec Grav, UA: 1.02 (ref 1.010–1.025)
Urobilinogen, UA: 0.2 U/dL
pH, UA: 8.5 — AB (ref 5.0–8.0)

## 2024-07-07 NOTE — Progress Notes (Signed)
   CHIEF COMPLAINT  UA/ only visit fot UTI     REASON FOR VISIT  Possible UTI, UA Visit Only      ASSESSMENT & PLAN Diagnoses and all orders for this visit:  Dysuria -     POCT Urinalysis Dipstick (18997) -     Urine Culture  Acute vaginitis -     NuSwab VG+, HSV     Patient notified.  Total time spent: 5 minutes  ALAN CHRISTELLA ARRANT, FNP 07/07/2024

## 2024-07-10 LAB — URINE CULTURE

## 2024-07-11 ENCOUNTER — Ambulatory Visit: Payer: Self-pay | Admitting: Family

## 2024-07-11 LAB — NUSWAB VG+, HSV
Atopobium vaginae: HIGH {score} — AB
BVAB 2: HIGH {score} — AB
Candida albicans, NAA: NEGATIVE
Candida glabrata, NAA: NEGATIVE
Chlamydia trachomatis, NAA: NEGATIVE
HSV 1 NAA: NEGATIVE
HSV 2 NAA: NEGATIVE
Neisseria gonorrhoeae, NAA: NEGATIVE
Trich vag by NAA: POSITIVE — AB

## 2024-07-11 MED ORDER — NITROFURANTOIN MONOHYD MACRO 100 MG PO CAPS: 100.0000 mg | ORAL_CAPSULE | Freq: Two times a day (BID) | ORAL | 0 refills | Status: DC

## 2024-07-21 ENCOUNTER — Other Ambulatory Visit: Payer: Self-pay | Admitting: Family

## 2024-07-21 MED ORDER — METRONIDAZOLE 500 MG PO TABS: 500.0000 mg | ORAL_TABLET | Freq: Two times a day (BID) | ORAL | 0 refills | Status: AC

## 2024-07-21 NOTE — Progress Notes (Signed)
 Patient notified

## 2024-09-11 MED ORDER — DOXYCYCLINE HYCLATE 100 MG PO CAPS: 100.0000 mg | ORAL_CAPSULE | Freq: Two times a day (BID) | ORAL | 0 refills | Status: DC

## 2024-09-11 MED ORDER — FLUCONAZOLE 150 MG PO TABS: 150.0000 mg | ORAL_TABLET | Freq: Every day | ORAL | 0 refills | Status: DC

## 2024-09-11 NOTE — Addendum Note (Signed)
 Addended by: ORLEAN PALMA on: 09/11/2024 12:21 PM   Modules accepted: Orders

## 2024-11-13 ENCOUNTER — Ambulatory Visit: Admitting: Cardiology

## 2024-11-17 ENCOUNTER — Encounter: Payer: Self-pay | Admitting: Cardiology

## 2024-11-17 ENCOUNTER — Ambulatory Visit: Admitting: Cardiology

## 2024-11-17 VITALS — BP 110/68 | HR 72 | Ht 66.0 in | Wt 148.6 lb

## 2024-11-17 DIAGNOSIS — Z0130 Encounter for examination of blood pressure without abnormal findings: Secondary | ICD-10-CM

## 2024-11-17 DIAGNOSIS — J019 Acute sinusitis, unspecified: Secondary | ICD-10-CM | POA: Diagnosis not present

## 2024-11-17 MED ORDER — AMOXICILLIN-POT CLAVULANATE 875-125 MG PO TABS: 1.0000 | ORAL_TABLET | Freq: Two times a day (BID) | ORAL | 0 refills | Status: AC

## 2024-11-17 MED ORDER — FLUCONAZOLE 150 MG PO TABS: ORAL_TABLET | ORAL | 0 refills | Status: AC

## 2024-11-17 MED ORDER — BENZONATATE 200 MG PO CAPS: 200.0000 mg | ORAL_CAPSULE | Freq: Three times a day (TID) | ORAL | 0 refills | Status: AC | PRN

## 2024-11-17 NOTE — Progress Notes (Signed)
 "  Established Patient Office Visit  Subjective:  Patient ID: Lindsey Green, female    DOB: [birth year]  Age: 44 y.o. MRN: [id number]  Chief Complaint  Patient presents with   Acute Visit    Cough started 2 weeks ago, headache, sore throat, green flem, lack of appetite.    Patient in office for an acute visit, complaining of a cough that started about 2 weeks ago. Also complaining of yellow sputum, headache, sinus pain, PND, nasal congestion and sore throat. Patient has tried Mucinex with no relief.  Will send in Augmentin , tessalon  pearls. Recommend using nasal spray.  Drink plenty of water.  Normal blood pressure today.  Cough This is a new problem. The current episode started 1 to 4 weeks ago. The cough is Productive of purulent sputum. Associated symptoms include headaches, nasal congestion, postnasal drip, rhinorrhea and a sore throat. Pertinent negatives include no chest pain, myalgias or shortness of breath. Associated symptoms comments: Lack of appetite. Risk factors for lung disease include smoking/tobacco exposure. Treatments tried: Mucinex. The treatment provided no relief.    No other concerns at this time.   Past Medical History:  Diagnosis Date   Anemia    LGSIL on Pap smear of cervix 2016   no colpo   Non-recurrent acute suppurative otitis media of right ear with spontaneous rupture of tympanic membrane 03/31/2021   Last Assessment & Plan: Formatting of this note might be different from the original. Recent right ear discomfort and drainage. Currently finishing up treatment for the above.  She is using topical drops. EXAM shows moist debris in the right ear that was cleaned out meticulously under the microscope revealing otherwise normal external canal with intact tympanic membrane. PLAN: Looks like she is he   Renal disorder    several kidney infections and UTIs   Surgical wound dehiscence 05/27/2021   Last Assessment & Plan: Formatting of this note might be  different from the original. Concern over postauricular wounds. 2 weeks out from excision of postauricular infections.  Subsequently has developed dehiscence of the wound on both sides.  She has been cleaning the wound with peroxide and applying topical antibiotic ointment.  She also has been on oral antibiotics.  The area is tender. EXAMINA   UTI (urinary tract infection)     Past Surgical History:  Procedure Laterality Date   BLADDER REPAIR     CESAREAN SECTION     4 cesareans   INCISIONAL HERNIA REPAIR     bladder repair with mesh; CAN NEVER HAVE ABD SURG AGAIN     Social History   Socioeconomic History   Marital status: Single    Spouse name: Not on file   Number of children: Not on file   Years of education: Not on file   Highest education level: Not on file  Occupational History   Not on file  Tobacco Use   Smoking status: Every Day    Types: Cigarettes   Smokeless tobacco: Never  Vaping Use   Vaping status: Never Used  Substance and Sexual Activity   Alcohol use: Yes    Comment: occ   Drug use: Yes    Frequency: 7.0 times per week    Types: Marijuana   Sexual activity: Yes    Birth control/protection: I.U.D., Condom  Other Topics Concern   Not on file  Social History Narrative   Not on file   Social Drivers of Health   Tobacco Use: High Risk (11/17/2024)  Patient History    Smoking Tobacco Use: Every Day    Smokeless Tobacco Use: Never    Passive Exposure: Not on file  Financial Resource Strain: Not on file  Food Insecurity: Not on file  Transportation Needs: Not on file  Physical Activity: Not on file  Stress: Not on file  Social Connections: Not on file  Intimate Partner Violence: Not on file  Depression (EYV7-0): Not on file  Alcohol Screen: Not on file  Housing: Not on file  Utilities: Not on file  Health Literacy: Not on file    Family History  Problem Relation Age of Onset   Hypertension Mother    Hyperlipidemia Mother    Hypotension  Mother    Ovarian cancer Maternal Aunt        ? age   Breast cancer Neg Hx     Allergies[1]  Show/hide medication list[2]  Review of Systems  Constitutional: Negative.   HENT:  Positive for postnasal drip, rhinorrhea and sore throat.   Eyes: Negative.   Respiratory:  Positive for cough. Negative for shortness of breath.   Cardiovascular: Negative.  Negative for chest pain.  Gastrointestinal: Negative.  Negative for abdominal pain, constipation and diarrhea.  Genitourinary: Negative.   Musculoskeletal:  Negative for joint pain and myalgias.  Skin: Negative.   Neurological:  Positive for headaches. Negative for dizziness.  Endo/Heme/Allergies: Negative.   All other systems reviewed and are negative.      Objective:   BP 110/68   Pulse 72   Ht 5' 6 (1.676 m)   Wt 148 lb 9.6 oz (67.4 kg)   SpO2 98%   BMI 23.98 kg/m   Vitals:   11/17/24 1017  BP: 110/68  Pulse: 72  Height: 5' 6 (1.676 m)  Weight: 148 lb 9.6 oz (67.4 kg)  SpO2: 98%  BMI (Calculated): 24    Physical Exam Vitals and nursing note reviewed.  Constitutional:      Appearance: Normal appearance. She is normal weight.  HENT:     Head: Normocephalic and atraumatic.     Nose: Nose normal.     Mouth/Throat:     Mouth: Mucous membranes are moist.  Eyes:     Extraocular Movements: Extraocular movements intact.     Conjunctiva/sclera: Conjunctivae normal.     Pupils: Pupils are equal, round, and reactive to light.  Cardiovascular:     Rate and Rhythm: Normal rate and regular rhythm.     Pulses: Normal pulses.     Heart sounds: Normal heart sounds.  Pulmonary:     Effort: Pulmonary effort is normal.     Breath sounds: Normal breath sounds.  Abdominal:     General: Abdomen is flat. Bowel sounds are normal.     Palpations: Abdomen is soft.  Musculoskeletal:        General: Normal range of motion.     Cervical back: Normal range of motion.  Skin:    General: Skin is warm and dry.  Neurological:      General: No focal deficit present.     Mental Status: She is alert and oriented to person, place, and time.  Psychiatric:        Mood and Affect: Mood normal.        Behavior: Behavior normal.        Thought Content: Thought content normal.        Judgment: Judgment normal.      No results found for any visits on 11/17/24.  No results found for this or any previous visit (from the past 2160 hours).    Assessment & Plan:  Augmentin  Tessalon  pearls Diflucan  Nasal spray Drink plenty of water  Problem List Items Addressed This Visit       Respiratory   Acute non-recurrent sinusitis - Primary   Relevant Medications   amoxicillin -clavulanate (AUGMENTIN ) 875-125 MG tablet   benzonatate  (TESSALON ) 200 MG capsule   fluconazole  (DIFLUCAN ) 150 MG tablet    Return in about 3 weeks (around 12/08/2024) for with Alan.   Total time spent: 25 minutes. This time includes review of previous notes and results and patient face to face interaction during today's visit.    Jeoffrey Pollen, NP  11/17/2024   This document may have been prepared by [facility] Voice Recognition software and as such may include unintentional dictation errors.     [1]  Allergies Allergen Reactions   Fire Ant Anaphylaxis   Penicillins Anaphylaxis and Rash    Has patient had a PCN reaction causing immediate rash, facial/tongue/throat swelling, SOB or lightheadedness with hypotension: Yes  Has patient had a PCN reaction causing severe rash involving mucus membranes or skin necrosis: Yes  Has patient had a PCN reaction that required hospitalization Yes- went to ed  Has patient had a PCN reaction occurring within the last 10 years: No- was a childhood allergy  If all of the above answers are NO, then may proceed with Cephalosporin use.  Has patient had a PCN reaction causing immediate rash, facial/tongue/throat swelling, SOB or lightheadedness with hypotension: Yes  Has patient had a PCN reaction causing  severe rash involving mucus membranes or skin necrosis: Yes  Has patient had a PCN reaction that required hospitalization Yes- went to ed  Has patient had a PCN reaction occurring within the last 10 years: No- was a childhood allergy  If all of the above answers are NO, then may proceed with Cephalosporin use.  Has patient had a PCN reaction causing immediate rash, facial/tongue/throat swelling, SOB or lightheadedness with hypotension: Yes  Has patient had a PCN reaction causing severe rash involving mucus membranes or skin necrosis: Yes  Has patient had a PCN reaction that required hospitalization Yes- went to ed  Has patient had a PCN reaction occurring within the last 10 years: No- was a childhood allergy  If all of the above answers are NO, then may proceed with Cephalosporin use.  Has patient had a PCN reaction causing immediate rash, facial/tongue/throat swelling, SOB or lightheadedness with hypotension: Yes  Has patient had a PCN reaction causing severe rash involving mucus membranes or skin necrosis: Yes  Has patient had a PCN reaction that required hospitalization Yes- went to ed  Has patient had a PCN reaction occurring within the last 10 years: No- was a childhood allergy  If all of the above answers are NO, then may proceed with Cephalosporin use.  Has patient had a PCN reaction causing immediate rash, facial/tongue/throat swelling, SOB or lightheadedness with hypotension: Yes, Has patient had a PCN reaction causing severe rash involving mucus membranes or skin necrosis: Yes, Has patient had a PCN reaction that required hospitalization Yes- went to ed, Has patient had a PCN reaction occurring within the last 10 years: No- was a childhood allergy, If all of the above answers are NO, then may proceed with Cephalosporin use., Has patient had a PCN   Ciprofloxacin Other (See Comments)  [2]  Outpatient Medications Prior to Visit  Medication Sig   acetaminophen  (TYLENOL ) 500  MG  tablet Take 1,000 mg by mouth every 6 (six) hours as needed for fever.   albuterol (VENTOLIN HFA) 108 (90 Base) MCG/ACT inhaler Inhale 1 puff into the lungs every 4 (four) hours as needed.   EPINEPHrine  0.15 MG/0.15ML IJ injection Inject 0.15 mg into the muscle as needed.   ibuprofen  (ADVIL ) 400 MG tablet Take 1 tablet (400 mg total) by mouth every 6 (six) hours as needed for fever or mild pain.   IRON, FERROUS GLUCONATE, PO Take 1 tablet by mouth daily as needed (low iron).   levonorgestrel (MIRENA) 20 MCG/24HR IUD 1 each by Intrauterine route once.   Multiple Vitamins-Minerals (MULTI ADULT GUMMIES PO) Take 2 capsules by mouth daily.   [DISCONTINUED] fluconazole  (DIFLUCAN ) 150 MG tablet Take 1 tablet (150 mg total) by mouth daily.   cyclobenzaprine  (FLEXERIL ) 10 MG tablet Take 1 tablet (10 mg total) by mouth 3 (three) times daily. (Patient not taking: Reported on 11/17/2024)   fexofenadine -pseudoephedrine (ALLEGRA-D) 60-120 MG 12 hr tablet Take 1 tablet by mouth 2 (two) times daily. (Patient not taking: Reported on 11/17/2024)   HYDROcodone -acetaminophen  (NORCO/VICODIN) 5-325 MG tablet Take 1 tablet by mouth every 4 (four) hours as needed for moderate pain. (Patient not taking: Reported on 11/17/2024)   hydrOXYzine  (ATARAX ) 10 MG tablet Take 1 tablet (10 mg total) by mouth 3 (three) times daily as needed. (Patient not taking: Reported on 11/17/2024)   [DISCONTINUED] doxycycline  (VIBRAMYCIN ) 100 MG capsule Take 1 capsule (100 mg total) by mouth 2 (two) times daily. (Patient not taking: Reported on 11/17/2024)   [DISCONTINUED] nitrofurantoin , macrocrystal-monohydrate, (MACROBID ) 100 MG capsule Take 1 capsule (100 mg total) by mouth 2 (two) times daily. (Patient not taking: Reported on 11/17/2024)   No facility-administered medications prior to visit.   "
